# Patient Record
Sex: Female | Born: 1990 | Race: Black or African American | Hispanic: No | Marital: Single | Smoking: Never smoker
Health system: Southern US, Community
[De-identification: ages and names within clinical notes are randomized; demographics above are authoritative.]

## PROBLEM LIST (undated history)

## (undated) DIAGNOSIS — R7303 Prediabetes: Secondary | ICD-10-CM

## (undated) DIAGNOSIS — G4733 Obstructive sleep apnea (adult) (pediatric): Principal | ICD-10-CM

## (undated) DIAGNOSIS — Z460 Encounter for fitting and adjustment of spectacles and contact lenses: Secondary | ICD-10-CM

## (undated) DIAGNOSIS — R6889 Other general symptoms and signs: Secondary | ICD-10-CM

## (undated) DIAGNOSIS — R040 Epistaxis: Secondary | ICD-10-CM

## (undated) DIAGNOSIS — J349 Unspecified disorder of nose and nasal sinuses: Secondary | ICD-10-CM

## (undated) DIAGNOSIS — E282 Polycystic ovarian syndrome: Secondary | ICD-10-CM

## (undated) DIAGNOSIS — I10 Essential (primary) hypertension: Secondary | ICD-10-CM

## (undated) HISTORY — DX: Other general symptoms and signs: R68.89

## (undated) HISTORY — DX: Encounter for fitting and adjustment of spectacles and contact lenses: Z46.0

## (undated) HISTORY — DX: Epistaxis: R04.0

## (undated) HISTORY — DX: Polycystic ovarian syndrome: E28.2

## (undated) HISTORY — DX: Morbid (severe) obesity due to excess calories: E66.01

## (undated) HISTORY — DX: Essential (primary) hypertension: I10

## (undated) HISTORY — DX: Unspecified disorder of nose and nasal sinuses: J34.9

## (undated) HISTORY — DX: Prediabetes: R73.03

## (undated) HISTORY — DX: Obstructive sleep apnea (adult) (pediatric): G47.33

---
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
## 2008-07-16 ENCOUNTER — Emergency Department (HOSPITAL_COMMUNITY): Admission: EM | Admit: 2008-07-16 | Discharge: 2008-07-16 | Payer: Self-pay | Admitting: Emergency Medicine

## 2009-11-09 ENCOUNTER — Emergency Department (HOSPITAL_COMMUNITY): Admission: EM | Admit: 2009-11-09 | Discharge: 2009-11-10 | Payer: Self-pay | Admitting: Emergency Medicine

## 2010-05-21 LAB — RAPID STREP SCREEN (MED CTR MEBANE ONLY): Streptococcus, Group A Screen (Direct): NEGATIVE

## 2010-06-16 LAB — URINE MICROSCOPIC-ADD ON

## 2010-06-16 LAB — URINALYSIS, ROUTINE W REFLEX MICROSCOPIC
Bilirubin Urine: NEGATIVE
Glucose, UA: NEGATIVE mg/dL
Ketones, ur: NEGATIVE mg/dL
Nitrite: NEGATIVE
Protein, ur: NEGATIVE mg/dL
Specific Gravity, Urine: 1.022 (ref 1.005–1.030)
Urobilinogen, UA: 1 mg/dL (ref 0.0–1.0)
pH: 6.5 (ref 5.0–8.0)

## 2010-06-16 LAB — PREGNANCY, URINE: Preg Test, Ur: NEGATIVE

## 2010-06-22 ENCOUNTER — Other Ambulatory Visit: Payer: Self-pay | Admitting: Obstetrics and Gynecology

## 2010-06-22 ENCOUNTER — Other Ambulatory Visit (HOSPITAL_COMMUNITY)
Admission: RE | Admit: 2010-06-22 | Discharge: 2010-06-22 | Disposition: A | Discharge status: Home / Self Care | Payer: BC Managed Care – PPO | Source: Ambulatory Visit | Attending: Obstetrics and Gynecology | Admitting: Obstetrics and Gynecology

## 2010-06-22 DIAGNOSIS — Z113 Encounter for screening for infections with a predominantly sexual mode of transmission: Secondary | ICD-10-CM | POA: Insufficient documentation

## 2010-06-22 DIAGNOSIS — Z01419 Encounter for gynecological examination (general) (routine) without abnormal findings: Secondary | ICD-10-CM | POA: Insufficient documentation

## 2010-07-24 ENCOUNTER — Other Ambulatory Visit (INDEPENDENT_AMBULATORY_CARE_PROVIDER_SITE_OTHER): Payer: Self-pay | Admitting: Surgery

## 2010-07-24 NOTE — Group Therapy Note (Signed)
NAMETARISSA, KERIN NO.:  [id number]   MEDICAL RECORD NO.:  [id number]          PATIENT TYPE:  WOC   LOCATION:  [facility]                   FACILITY:  [facility]   PHYSICIAN:  Argentina Donovan, MD        DATE OF BIRTH:  12/20/1990   DATE OF SERVICE:                                    CLINIC NOTE   The patient is a 20 year old black female nulligravida, virginal, who was  placed on oral contraceptives approximately a year ago for polycystic  ovarian syndrome.  She weighs 245 pounds and is 5 feet 3 inches tall.  She  recently ran out of her prescription, her mother states, and then she  started having significant problems with dysfunctional uterine bleeding.  Was placed back on it and that has been well controlled since then.  We had  a long discussion with the patient and her mother about insulin  insensitivity, as well as polycystic ovarian syndrome.  We, in conjunction  with her mother, decided to try the Glucophage since she has already been on  the oral Tri-Cyclen for a while.  Her acne has been improved incredibly, her  mother says, since she has been on it and her periods are well controlled.  We discussed other methods of hormone therapy, as well as the patch and the  ring, but she seems to be doing well on taking the pill regularly.  It was a  long discussion concerning polycystic ovarian syndrome for 30 minutes.  I  went over the problem and I am going to see the patient back in 2 months to  see how she is doing on the Glucophage.           ______________________________  Argentina Donovan, MD     PR/MEDQ  D:  04/27/2005  T:  04/27/2005  Job:  [id number]

## 2010-07-30 ENCOUNTER — Ambulatory Visit (HOSPITAL_COMMUNITY)
Admission: RE | Admit: 2010-07-30 | Discharge: 2010-07-30 | Disposition: A | Discharge status: Home / Self Care | Payer: BC Managed Care – PPO | Source: Ambulatory Visit | Attending: Surgery | Admitting: Surgery

## 2010-07-30 DIAGNOSIS — Z6841 Body Mass Index (BMI) 40.0 and over, adult: Secondary | ICD-10-CM | POA: Insufficient documentation

## 2010-08-11 ENCOUNTER — Ambulatory Visit (HOSPITAL_COMMUNITY)
Admission: RE | Admit: 2010-08-11 | Discharge: 2010-08-11 | Disposition: A | Discharge status: Home / Self Care | Payer: BC Managed Care – PPO | Source: Ambulatory Visit | Attending: Surgery | Admitting: Surgery

## 2010-08-11 DIAGNOSIS — Z6841 Body Mass Index (BMI) 40.0 and over, adult: Secondary | ICD-10-CM | POA: Insufficient documentation

## 2010-08-11 DIAGNOSIS — Z01818 Encounter for other preprocedural examination: Secondary | ICD-10-CM | POA: Insufficient documentation

## 2010-08-14 ENCOUNTER — Encounter: Payer: BC Managed Care – PPO | Attending: Surgery | Admitting: *Deleted

## 2010-08-14 DIAGNOSIS — Z01818 Encounter for other preprocedural examination: Secondary | ICD-10-CM | POA: Insufficient documentation

## 2010-08-14 DIAGNOSIS — Z713 Dietary counseling and surveillance: Secondary | ICD-10-CM | POA: Insufficient documentation

## 2010-08-25 ENCOUNTER — Ambulatory Visit: Payer: BC Managed Care – PPO | Admitting: *Deleted

## 2010-09-01 ENCOUNTER — Ambulatory Visit (HOSPITAL_BASED_OUTPATIENT_CLINIC_OR_DEPARTMENT_OTHER): Payer: BC Managed Care – PPO | Attending: Surgery

## 2010-09-01 DIAGNOSIS — G4733 Obstructive sleep apnea (adult) (pediatric): Secondary | ICD-10-CM | POA: Insufficient documentation

## 2010-09-06 DIAGNOSIS — G4733 Obstructive sleep apnea (adult) (pediatric): Secondary | ICD-10-CM

## 2010-09-06 NOTE — Procedures (Signed)
Marissa Miller, AHART NO.:  [id number]  MEDICAL RECORD NO.:  [id number]          PATIENT TYPE:  OUT  LOCATION:  SLEEP CENTER                 FACILITY:  [facility]  PHYSICIAN:  Deakon Frix D. Maple Hudson, MD, FCCP, FACPDATE OF BIRTH:  06/15/88  DATE OF STUDY:  09/01/2010                           NOCTURNAL POLYSOMNOGRAM  REFERRING PHYSICIAN:  Thornton Park. Daphine Deutscher, MD  INDICATION FOR STUDY:  Hypersomnia with sleep apnea.  EPWORTH SLEEPINESS SCORE:  5/24.  BMI 58.4.  Weight 340 pounds, height 64 inches.  Neck 16 inches.  MEDICATIONS:  Home medications are charted and reviewed.  SLEEP ARCHITECTURE:  Total sleep time 257.5 minutes with sleep efficiency 70.2%.  Stage I was 22.1%, stage II 59.8%, stage III 3.1%, REM 15% of total sleep time.  Sleep latency 50 minutes, REM latency 90 minutes, awake after sleep onset 59 minutes, arousal index 43.1.  Bedtime medication:  Metformin.  RESPIRATORY DATA:  Apnea/hypopnea index (AHI) 12.3 per hour.  A total of 53 events was scored including 9 obstructive apneas and 44 hypopneas. Events were seen in all sleep positions, especially while supine.  REM AHI 21.8 per hour, RDI 41.2 per hour.  Sleep was initially quite fragmented and there was insufficient early sleep or events to permit initiation of CPAP titration by split protocol on this study night.  OXYGEN DATA:  Moderate snoring with oxygen desaturation to a nadir of 88% and a mean oxygen saturation through the study of 96.1% on room air.  CARDIAC DATA:  Sinus rhythm with PACs.  MOVEMENT-PARASOMNIA:  No significant movement disturbance.  Bathroom x1.  IMPRESSIONS-RECOMMENDATIONS: 1. Mild obstructive sleep apnea/hypopnea syndrome, apnea/hypopnea     index 12.3 per hour with non-positional events, increased while     supine and in REM.  Moderate snoring with oxygen desaturation to a     nadir of 88% and a mean oxygen saturation through the study of     96.1% on room air. 2. She did  not meet requirements for initiation of continuous positive     airway pressure split protocol titration on     the study night.  Consider return for a dedicated CPAP titration     study, or evaluate for alternative management as clinically     indicated.     [name], MD, [facility], FACP Diplomate, Biomedical engineer of Sleep Medicine Electronically Signed    CDY/MEDQ  D:  09/06/2010 07:30:37  T:  09/06/2010 23:11:41  Job:  [id number]

## 2010-10-15 ENCOUNTER — Encounter: Payer: BC Managed Care – PPO | Attending: Surgery

## 2010-10-15 ENCOUNTER — Ambulatory Visit: Payer: BC Managed Care – PPO

## 2010-10-15 DIAGNOSIS — I10 Essential (primary) hypertension: Secondary | ICD-10-CM | POA: Insufficient documentation

## 2010-10-15 DIAGNOSIS — Z713 Dietary counseling and surveillance: Secondary | ICD-10-CM | POA: Insufficient documentation

## 2010-10-15 DIAGNOSIS — G4730 Sleep apnea, unspecified: Secondary | ICD-10-CM

## 2010-10-15 DIAGNOSIS — Z01818 Encounter for other preprocedural examination: Secondary | ICD-10-CM | POA: Insufficient documentation

## 2010-10-15 DIAGNOSIS — R7303 Prediabetes: Secondary | ICD-10-CM

## 2010-10-15 NOTE — Progress Notes (Signed)
  Pre-Operative Nutrition Class  Patient was seen on 10/15/2010 for Pre-Operative Nutrition education at [facility]   Surgery date: 11/10/10 Surgery type: LAGB  Weight today: 344.8 lbs Weight change: 3.4lbs gain Total weight lost: N/A BMI: 59.1  Samples given per MNT protocol: Bariatric Advantage Multivitamin Lot # [id number] Exp: 12/12  Bariatric Advantage Calcium Citrate Lot # [id number] Exp: 9/13  Celebrate Vitamins Multivitamin Lot # [id number] Exp: 9/13  Celebrate VitaminsCalcium Citrate Lot #[id number] Exp: 4/13  The following the learning objective met by the patient during this course:   Identifies Pre-Op Dietary Goals and will begin 2 weeks pre-operatively   Identifies appropriate sources of fluids and proteins   States protein recommendations and appropriate sources pre and post-operatively  Identifies Post-Operative Dietary Goals and will follow for 2 weeks post-operatively  Identifies appropriate multivitamin and calcium sources  Describes the need for physical activity post-operatively and will follow MD recommendations  States when to call healthcare provider regarding medication questions or post-operative complications  Follow-Up Plan: Patient will follow-up at [facility] 2 weeks post operatively for diet advancement per MD.

## 2010-10-16 ENCOUNTER — Institutional Professional Consult (permissible substitution): Payer: BC Managed Care – PPO | Admitting: Pulmonary Disease

## 2010-10-27 ENCOUNTER — Encounter (INDEPENDENT_AMBULATORY_CARE_PROVIDER_SITE_OTHER): Payer: Self-pay | Admitting: General Surgery

## 2010-10-28 ENCOUNTER — Encounter (INDEPENDENT_AMBULATORY_CARE_PROVIDER_SITE_OTHER): Payer: Self-pay | Admitting: Surgery

## 2010-10-28 ENCOUNTER — Other Ambulatory Visit (INDEPENDENT_AMBULATORY_CARE_PROVIDER_SITE_OTHER): Payer: Self-pay | Admitting: General Surgery

## 2010-10-28 ENCOUNTER — Ambulatory Visit (INDEPENDENT_AMBULATORY_CARE_PROVIDER_SITE_OTHER): Payer: BC Managed Care – PPO | Admitting: Surgery

## 2010-10-28 DIAGNOSIS — Z419 Encounter for procedure for purposes other than remedying health state, unspecified: Secondary | ICD-10-CM

## 2010-10-28 MED ORDER — PEG 3350-KCL-NA BICARB-NACL 420 G PO SOLR: 4000.0000 mL | Freq: Once | ORAL | Status: AC

## 2010-10-28 NOTE — Patient Instructions (Signed)
Follow the preop low carb diet

## 2010-10-28 NOTE — Progress Notes (Signed)
Subjective:     Patient ID: Marissa Miller, female   DOB: 1990/07/13, 20 y.o.   MRN: [id number]  HPI The patient comes in today for a preop visit. She has no further questions about the procedure. I encouraged her to stay on the preoperative diet because it appears that she has gained a little weight since her initial visit of Jul 24, 2010. Today's weight is 343 and she was 337 4. Before.   Dr. Nehemiah Settle has detected  borderline diabetes and has elevations in her blood pressure. She  Wants to proceed with a LAP-BAND and had no further questions.  Her preoperative upper GI was normal and showed no hiatal hernia or reflux. Her gallbladder ultrasound showed no gallstones or abnormalities. Chest x-ray was unremarkable. EKG was normal laboratory showed her hemoglobin to be 12.3 electrolytes normal. Cholesterol normal. Hemoglobin A1c was 5.9 which is slightly elevated.  Will proceed with LAP-BAND on September 4.  Past Medical History  Diagnosis Date  . Morbid obesity   . Pre-diabetes   . Hypertension   . PCOS (polycystic ovarian syndrome)    Current Outpatient Prescriptions  Medication Sig Dispense Refill  . IBUPROFEN PO Take by mouth as needed.        . metFORMIN (GLUMETZA) 500 MG (MOD) 24 hr tablet Take 500 mg by mouth daily with breakfast.        . norgestimate-ethinyl estradiol (ORTHO-CYCLEN,SPRINTEC,PREVIFEM) 0.25-35 MG-MCG tablet Take 1 tablet by mouth daily.        History reviewed. No pertinent past surgical history.   Review of Systems  Constitutional: Negative.   HENT: Positive for nosebleeds and rhinorrhea.   Eyes: Negative.   Respiratory: Negative.   Cardiovascular: Negative.   Gastrointestinal: Negative.   Genitourinary: Negative.   Neurological: Negative.   Hematological: Negative.   Psychiatric/Behavioral: Negative.        Objective:   Physical Exam  Constitutional: She is oriented to person, place, and time. She appears well-developed and well-nourished.  HENT:  Head:  Atraumatic.  Eyes: Conjunctivae are normal. Pupils are equal, round, and reactive to light.  Neck: Normal range of motion. Neck supple.  Cardiovascular: Normal rate, regular rhythm and normal heart sounds.   Pulmonary/Chest: Effort normal and breath sounds normal.  Abdominal: Soft. Bowel sounds are normal.  Musculoskeletal: Normal range of motion.  Neurological: She is alert and oriented to person, place, and time. She has normal reflexes.  Skin: Skin is warm and dry.  Psychiatric: She has a normal mood and affect. Her behavior is normal. Judgment and thought content normal.   Filed Vitals:   10/28/10 1555  BP: 128/88  Pulse: 60  Temp: 96.6 F (35.9 C)  Resp: 16   Weight 343 BMI 58.6      Assessment:     Morbid obesity with Type II Diabetes    Plan:     Lapband

## 2010-10-29 ENCOUNTER — Ambulatory Visit: Payer: BC Managed Care – PPO

## 2010-10-30 ENCOUNTER — Other Ambulatory Visit (HOSPITAL_COMMUNITY): Payer: BC Managed Care – PPO

## 2010-11-03 ENCOUNTER — Other Ambulatory Visit (INDEPENDENT_AMBULATORY_CARE_PROVIDER_SITE_OTHER): Payer: Self-pay | Admitting: Surgery

## 2010-11-03 ENCOUNTER — Other Ambulatory Visit (HOSPITAL_COMMUNITY): Payer: BC Managed Care – PPO

## 2010-11-03 ENCOUNTER — Ambulatory Visit (INDEPENDENT_AMBULATORY_CARE_PROVIDER_SITE_OTHER): Payer: BC Managed Care – PPO | Admitting: Pulmonary Disease

## 2010-11-03 ENCOUNTER — Encounter: Payer: Self-pay | Admitting: Pulmonary Disease

## 2010-11-03 ENCOUNTER — Encounter (HOSPITAL_COMMUNITY)
Admission: RE | Admit: 2010-11-03 | Discharge: 2010-11-03 | Disposition: A | Discharge status: Home / Self Care | Payer: BC Managed Care – PPO | Source: Ambulatory Visit | Attending: Surgery | Admitting: Surgery

## 2010-11-03 VITALS — BP 118/86 | HR 112 | Temp 98.1°F | Ht 64.0 in | Wt 340.6 lb

## 2010-11-03 DIAGNOSIS — G4733 Obstructive sleep apnea (adult) (pediatric): Secondary | ICD-10-CM

## 2010-11-03 HISTORY — DX: Obstructive sleep apnea (adult) (pediatric): G47.33

## 2010-11-03 LAB — CBC
HCT: 36.5 % (ref 36.0–46.0)
Hemoglobin: 12.8 g/dL (ref 12.0–15.0)
MCH: 25.5 pg — ABNORMAL LOW (ref 26.0–34.0)
MCHC: 35.1 g/dL (ref 30.0–36.0)
MCV: 72.9 fL — ABNORMAL LOW (ref 78.0–100.0)
Platelets: 359 10*3/uL (ref 150–400)
RBC: 5.01 MIL/uL (ref 3.87–5.11)
RDW: 14.7 % (ref 11.5–15.5)
WBC: 9.8 10*3/uL (ref 4.0–10.5)

## 2010-11-03 LAB — SURGICAL PCR SCREEN
MRSA, PCR: NEGATIVE
Staphylococcus aureus: NEGATIVE

## 2010-11-03 LAB — COMPREHENSIVE METABOLIC PANEL
ALT: 30 U/L (ref 0–35)
AST: 24 U/L (ref 0–37)
Albumin: 3.7 g/dL (ref 3.5–5.2)
Alkaline Phosphatase: 65 U/L (ref 39–117)
BUN: 15 mg/dL (ref 6–23)
CO2: 28 mEq/L (ref 19–32)
Calcium: 9.9 mg/dL (ref 8.4–10.5)
Chloride: 99 mEq/L (ref 96–112)
Creatinine, Ser: 0.68 mg/dL (ref 0.50–1.10)
GFR calc Af Amer: >60 mL/min (ref >60)
GFR calc non Af Amer: >60 mL/min (ref >60)
Glucose, Bld: 94 mg/dL (ref 70–99)
Potassium: 4.3 mEq/L (ref 3.5–5.1)
Sodium: 136 mEq/L (ref 135–145)
Total Bilirubin: 0.6 mg/dL (ref 0.3–1.2)
Total Protein: 7.8 g/dL (ref 6.0–8.3)

## 2010-11-03 LAB — DIFFERENTIAL
Basophils Absolute: 0 10*3/uL (ref 0.0–0.1)
Basophils Relative: 0 % (ref 0–1)
Eosinophils Absolute: 0.2 10*3/uL (ref 0.0–0.7)
Eosinophils Relative: 2 % (ref 0–5)
Lymphocytes Relative: 27 % (ref 12–46)
Lymphs Abs: 2.6 10*3/uL (ref 0.7–4.0)
Monocytes Absolute: 0.8 10*3/uL (ref 0.1–1.0)
Monocytes Relative: 8 % (ref 3–12)
Neutro Abs: 6.2 10*3/uL (ref 1.7–7.7)
Neutrophils Relative %: 63 % (ref 43–77)

## 2010-11-03 LAB — HCG, SERUM, QUALITATIVE: Preg, Serum: NEGATIVE

## 2010-11-03 NOTE — Patient Instructions (Signed)
Will start you on cpap for your sleep apnea leading up to your surgery You need to take your cpap mask to the surgery, and they will put you on a cpap machine in the hospital to use at night while sleeping The goal is to lose weight, and your sleep apnea will resolve. followup with me in 4 weeks, but call me if having tolerance issues.

## 2010-11-03 NOTE — Progress Notes (Signed)
  Subjective:    Patient ID: Marissa Miller, female    DOB: [birth year], 20 y.o.   MRN: [id number]  HPI The patient is a morbidly obese 20 year old female who been asked to see for management of obstructive sleep apnea.  She is undergone recent sleep study, which shows mild sleep apnea with an AHI of 12 events per hour.  The patient is scheduled for bariatric surgery the beginning of next month.  She has been told that she is a loud snorer, but no one has mentioned an abnormal breathing pattern during sleep.  She feels that she is rested most mornings upon arising, and stays very busy at work which prohibits her from having significant sleep pressure.  However, she denies inappropriate daytime sleepiness in the evenings or while driving.  She has no issues watching movies or TV programs.  Of note, the patient's weight is up 50 pounds over the last 2 years, and her recent epworth score is only 5.  Sleep Questionnaire: What time do you typically go to bed?( Between what hours) 12 am How long does it take you to fall asleep? 10 to 15 mins How many times during the night do you wake up? 2 What time do you get out of bed to start your day? 0930 Do you drive or operate heavy machinery in your occupation? No How much has your weight changed (up or down) over the past two years? (In pounds) 50 lb (22.68 kg) Have you ever had a sleep study before? Yes If yes, location of study? [facility] If yes, date of study? 08/2010 Do you currently use CPAP? No Do you wear oxygen at any time? No    Review of Systems  Constitutional: Negative for fever and unexpected weight change.  HENT: Negative for ear pain, nosebleeds, congestion, sore throat, rhinorrhea, sneezing, trouble swallowing, dental problem, postnasal drip and sinus pressure.   Eyes: Negative for redness and itching.  Respiratory: Negative for cough, chest tightness, shortness of breath and wheezing.   Cardiovascular: Negative for palpitations and leg swelling.    Gastrointestinal: Negative for nausea and vomiting.  Genitourinary: Negative for dysuria.  Musculoskeletal: Negative for joint swelling.  Skin: Negative for rash.  Neurological: Negative for headaches.  Hematological: Does not bruise/bleed easily.  Psychiatric/Behavioral: Negative for dysphoric mood. The patient is not nervous/anxious.        Objective:   Physical Exam Constitutional:  Morbidly obese female, no acute distress  HENT:  Nares patent without discharge, but large turbs  Oropharynx without exudate, palate and uvula are elongated, very large tonsils.   Eyes:  Perrla, eomi, no scleral icterus  Neck:  No JVD, no TMG  Cardiovascular:  Normal rate, regular rhythm, no rubs or gallops.  No murmurs        Intact distal pulses  Pulmonary :  Normal breath sounds, no stridor or respiratory distress   No rales, rhonchi, or wheezing  Abdominal:  Soft, nondistended, bowel sounds present.  No tenderness noted.   Musculoskeletal:  No lower extremity edema noted.  Lymph Nodes:  No cervical lymphadenopathy noted  Skin:  No cyanosis noted  Neurologic:  Alert, appropriate, moves all 4 extremities without obvious deficit.         Assessment & Plan:

## 2010-11-03 NOTE — Assessment & Plan Note (Signed)
The patient has mild sleep apnea by her recent sleep study, but is undergoing bariatric surgery next month.  I have explained to her the sedating and pain meds can often result in worsening of her sleep apnea postoperatively, and therefore it would be to her advantage to treat during this time.  Studies have also shown that patients with sleep apnea undergoing bariatric surgery have improved results if their sleep apnea is treated ahead of time and during the postop period.  The patient is willing to give CPAP a try, and we'll therefore start her out at a moderate pressure level.  I have asked her to take her CPAP mask to her bariatric surgery, and they can put her on an auto titrating machine while there.

## 2010-11-10 ENCOUNTER — Ambulatory Visit (HOSPITAL_COMMUNITY)
Admission: RE | Admit: 2010-11-10 | Discharge: 2010-11-11 | Disposition: A | Discharge status: Home / Self Care | Payer: BC Managed Care – PPO | Source: Ambulatory Visit | Attending: Surgery | Admitting: Surgery

## 2010-11-10 DIAGNOSIS — E282 Polycystic ovarian syndrome: Secondary | ICD-10-CM

## 2010-11-10 DIAGNOSIS — Z6841 Body Mass Index (BMI) 40.0 and over, adult: Secondary | ICD-10-CM

## 2010-11-10 DIAGNOSIS — I10 Essential (primary) hypertension: Secondary | ICD-10-CM

## 2010-11-10 DIAGNOSIS — Z01812 Encounter for preprocedural laboratory examination: Secondary | ICD-10-CM | POA: Insufficient documentation

## 2010-11-10 DIAGNOSIS — G4733 Obstructive sleep apnea (adult) (pediatric): Secondary | ICD-10-CM | POA: Insufficient documentation

## 2010-11-10 HISTORY — PX: LAPAROSCOPIC GASTRIC BANDING: SHX1100

## 2010-11-11 ENCOUNTER — Ambulatory Visit (HOSPITAL_COMMUNITY): Payer: BC Managed Care – PPO

## 2010-11-11 DIAGNOSIS — Z9889 Other specified postprocedural states: Secondary | ICD-10-CM

## 2010-11-11 LAB — CBC
HCT: 37.3 % (ref 36.0–46.0)
Hemoglobin: 12.5 g/dL (ref 12.0–15.0)
MCH: 24.7 pg — ABNORMAL LOW (ref 26.0–34.0)
MCHC: 33.5 g/dL (ref 30.0–36.0)
MCV: 73.7 fL — ABNORMAL LOW (ref 78.0–100.0)
Platelets: 385 10*3/uL (ref 150–400)
RBC: 5.06 MIL/uL (ref 3.87–5.11)
RDW: 14.8 % (ref 11.5–15.5)
WBC: 12.9 10*3/uL — ABNORMAL HIGH (ref 4.0–10.5)

## 2010-11-11 LAB — DIFFERENTIAL
Basophils Absolute: 0 10*3/uL (ref 0.0–0.1)
Basophils Relative: 0 % (ref 0–1)
Eosinophils Absolute: 0 10*3/uL (ref 0.0–0.7)
Eosinophils Relative: 0 % (ref 0–5)
Lymphocytes Relative: 17 % (ref 12–46)
Lymphs Abs: 2.2 10*3/uL (ref 0.7–4.0)
Monocytes Absolute: 0.9 10*3/uL (ref 0.1–1.0)
Monocytes Relative: 7 % (ref 3–12)
Neutro Abs: 9.8 10*3/uL — ABNORMAL HIGH (ref 1.7–7.7)
Neutrophils Relative %: 76 % (ref 43–77)

## 2010-11-11 NOTE — Op Note (Signed)
  Marissa Miller, Marissa Miller NO.:  [id number]  MEDICAL RECORD NO.:  [id number]  LOCATION:  1524                         FACILITY:  [facility]  PHYSICIAN:  Thornton Park. Daphine Deutscher, MD  DATE OF BIRTH:  [birth year]  DATE OF PROCEDURE:  11/10/2010 DATE OF DISCHARGE:                              OPERATIVE REPORT   PREOPERATIVE DIAGNOSES:  Borderline diabetes and morbid obesity with BMI of 58, hypertension, and polycystic ovary syndrome.  PROCEDURE:  Laparoscopic adjustable gastric banding (Allergan APS system).  SURGEON:  Thornton Park. Daphine Deutscher, MD  ASSISTANT:  Sandria Bales. Ezzard Standing, MD  FINDINGS:  No hiatal hernia noted.  High APS system placed.  DESCRIPTION OF PROCEDURE:  [name] was taken to the OR on Tuesday, November 10, 2010, and given general anesthesia.  The abdomen was prepped with __________.  Time-out was performed.  The abdomen was entered through the left upper quadrant using 0 degree Optiview 10 mm without difficulty.  The abdomen was insufflated.  Standard trocar placements were used including a 15 placed obliquely in the right upper quadrant, and she was noted to have a very elastic abdomen.  The liver was retracted and no dimple was seen.  There was no preop history of reflux.  We went along and opened an area on the left side for the band passer and then opened the space on the right side where we could see and go along the right crus.  The band passer passed easily, came around behind the stomach.  An APS was chosen because despite her high BMI she did not have as much fat around her foregut.  This came on, snapped in place. After it was passed around to the band passer easily, we snapped it over the calibration tubing.  Calibration tube was withdrawn.  It was plicated with three sutures with a Surgidac and tie knots.  The tubing was then brought to the port on the right lower side, connected to a port and implanted in a subcutaneous location.  The patient  tolerated the procedure well.  Wound ports were closed with 4-0 Vicryl, benzoin and Steri-Strips.  The patient was taken to the recovery room in satisfactory addition.     Thornton Park Daphine Deutscher, MD     MBM/MEDQ  D:  11/10/2010  T:  11/10/2010  Job:  [id number]  cc:   Deirdre Peer. Polite, M.D. Fax: [phone]  Electronically Signed by Luretha Murphy MD on 11/11/2010 01:39:37 PM

## 2010-11-12 ENCOUNTER — Encounter: Payer: Self-pay | Admitting: Surgery

## 2010-11-24 ENCOUNTER — Encounter: Payer: BC Managed Care – PPO | Attending: Surgery | Admitting: *Deleted

## 2010-11-24 DIAGNOSIS — Z713 Dietary counseling and surveillance: Secondary | ICD-10-CM | POA: Insufficient documentation

## 2010-11-24 DIAGNOSIS — Z01818 Encounter for other preprocedural examination: Secondary | ICD-10-CM | POA: Insufficient documentation

## 2010-11-24 NOTE — Progress Notes (Signed)
  2 Week Post-Operative Nutrition Class  Patient was seen on 11/24/2010 for Post-Operative Nutrition education at [facility].   Surgery date: 11/10/10 Surgery type: LAGB  Weight today: 331.1 Weight change: 10.7 lbs lost BMI: 57%  The following the learning objective met the patient during this course:   Identifies Phase 3A (Soft, High Proteins) Dietary Goals and will begin from 2 weeks post-operatively to 2 months post-operatively   Identifies appropriate sources of fluids and proteins   States protein recommendations and appropriate sources post-operatively  Identifies the need for appropriate texture modifications, mastication, and bite sizes when consuming solids  Identifies appropriate multivitamin and calcium sources post-operatively  Describes the need for physical activity post-operatively and will follow MD recommendations  States when to call healthcare provider regarding medication questions or post-operative complications  Handouts given during class include:  Phase 3A: Soft, High Protein Diet Handout  Band Fill Guidelines Handout  Follow-Up Plan: Patient will follow-up at [facility] in 6 weeks for 2 months post-op nutrition visit for diet advancement per MD.

## 2010-11-24 NOTE — Patient Instructions (Signed)
Patient to follow Phase 3A-Soft, High Protein Diet and follow-up at [facility] in 6 weeks for 2 months post-op nutrition visit for diet advancement. 

## 2010-11-26 ENCOUNTER — Ambulatory Visit (INDEPENDENT_AMBULATORY_CARE_PROVIDER_SITE_OTHER): Payer: BC Managed Care – PPO | Admitting: Surgery

## 2010-11-26 ENCOUNTER — Encounter (INDEPENDENT_AMBULATORY_CARE_PROVIDER_SITE_OTHER): Payer: Self-pay | Admitting: Surgery

## 2010-11-26 NOTE — Progress Notes (Signed)
First postoperative visit for Marissa Miller who is 16 days postop. She has had modest weight loss since her surgery being down to 331.4 today. We will consider her for first fill in mid October. Her incisions are healing nicely. We've encouraged her to begin exercise and stay on the diet as prescribed. She will return to work on Sept 26

## 2010-12-01 ENCOUNTER — Ambulatory Visit: Payer: BC Managed Care – PPO | Admitting: Pulmonary Disease

## 2011-01-04 ENCOUNTER — Encounter: Payer: Self-pay | Admitting: *Deleted

## 2011-01-04 ENCOUNTER — Encounter: Payer: BC Managed Care – PPO | Attending: Surgery | Admitting: *Deleted

## 2011-01-04 DIAGNOSIS — Z713 Dietary counseling and surveillance: Secondary | ICD-10-CM | POA: Insufficient documentation

## 2011-01-04 DIAGNOSIS — Z01818 Encounter for other preprocedural examination: Secondary | ICD-10-CM | POA: Insufficient documentation

## 2011-01-04 NOTE — Progress Notes (Signed)
  Follow-up visit: 2 Months Post-Operative LAGB Surgery  Medical Nutrition Therapy:  Appt start time: 1500 end time:  1520.  Assessment:  Primary concerns today: post-operative bariatric surgery nutrition management. Fawna reports frustration with her LAGB due to no restriction in her portions and limited weight loss. She is to have a band fill per Dr. Daphine Deutscher next week.  Weight today: 331.8 lb Weight change: No changes Total weight lost: 10 lbs down BMI: 57.1 Weight goal: 150-200 lbs Surgery date: 11/10/10  24-hr recall:  B (AM): Atkin's Protein Drink Snk (AM): yogurt cup (Greek)   L (12-1 PM): Grilled chicken (3-6 oz), green beans (1 cup), rice (1 cup) Snk (PM): No snacking  D (PM): Pork chop, salad OR Hamburger w/out bun (5-7 oz) Snk (PM): yogurt cup OR Protein Drink  Fluid intake: Crystal Light, water, juices = 32 oz - 50 oz Estimated total protein intake: 60-75g  Medications: No medication changes Supplementation: Taking regularly  Using straws: No Drinking while eating: YES Hair loss: None reported Carbonated beverages: No N/V/D/C: Regurgitation if takes a large bite Last Lap-Band fill: No fill at this time  Recent physical activity:  Going to the gym, 3 times/week,   Progress Towards Goal(s):  Some progress.  Handouts given during visit include:  Phase 3B - High Protein + Non-Starchy Vegetables   Nutritional Diagnosis:  NI-1.5 Excessive energy intake As related to limited restriction with LAGB.  As evidenced by pt who has not had a band fill, eating portion sizes of 6-8 oz at one sitting.    Intervention:  Nutrition education.  Monitoring/Evaluation:  Dietary intake, exercise, lap band fills, and body weight. Follow up in 6 weeks for 3-4 month post-op visit.

## 2011-01-04 NOTE — Patient Instructions (Signed)
Goals:  Follow Phase 3B: High Protein + Non-Starchy Vegetables  Eat 3-6 small meals/snacks, every 3-5 hrs  Increase lean protein foods to meet 65-85g goal  Increase fluid intake to 64oz +  Avoid drinking 15 minutes before, during and 30 minutes after eating  Aim for >30 min of physical activity daily

## 2011-01-13 ENCOUNTER — Ambulatory Visit (INDEPENDENT_AMBULATORY_CARE_PROVIDER_SITE_OTHER): Payer: BC Managed Care – PPO | Admitting: Surgery

## 2011-01-13 ENCOUNTER — Encounter (INDEPENDENT_AMBULATORY_CARE_PROVIDER_SITE_OTHER): Payer: Self-pay | Admitting: Surgery

## 2011-01-13 VITALS — BP 138/88 | HR 80 | Temp 97.2°F | Resp 20 | Ht 64.0 in | Wt 330.0 lb

## 2011-01-13 DIAGNOSIS — Z9884 Bariatric surgery status: Secondary | ICD-10-CM

## 2011-01-13 NOTE — Progress Notes (Signed)
Marissa Miller 20 y.o.  Body mass index is 56.64 kg/(m^2).  Patient Active Problem List  Diagnoses  . Morbid obesity  . HTN (hypertension)  . Pre-diabetes  . OSA (obstructive sleep apnea)    No Known Allergies  Past Surgical History  Procedure Date  . Laparoscopic gastric banding 11/10/10   POLITE,RONALD D, MD No diagnosis found.  Patient comes in today and she is 2 months postop. She has an APS band in place. I gave her 1.5 cc in her band today. She lost about 14 pounds since surgery. We'll see her back in 4-6 weeks.  Matt B. Daphine Deutscher, MD, [facility] [phone] beeper [phone]  01/13/2011 5:19 PM

## 2011-02-15 ENCOUNTER — Encounter: Payer: Self-pay | Admitting: *Deleted

## 2011-02-15 ENCOUNTER — Encounter: Payer: BC Managed Care – PPO | Attending: Surgery | Admitting: *Deleted

## 2011-02-15 DIAGNOSIS — Z01818 Encounter for other preprocedural examination: Secondary | ICD-10-CM | POA: Insufficient documentation

## 2011-02-15 DIAGNOSIS — Z713 Dietary counseling and surveillance: Secondary | ICD-10-CM | POA: Insufficient documentation

## 2011-02-15 NOTE — Patient Instructions (Addendum)
Goals:  Continue on High Protein/Low Carb Regimen Follow Phase 3B: High Protein + Non-Starchy Vegetables  Eat 3-6 small meals/snacks, every 3-5 hrs  Increase lean protein foods to meet 65-85g goal  Increase fluid intake to 64oz +  AVOID drinking 15 minutes before, during and 30 minutes after eating  Aim for >30 min of physical activity daily Utilize protein bars if needed in place of sweets [facility], [employer]'s or [employer])  Down 3 lbs in 6 weeks, lost a total of 13 lbs.  Keep up the good work! Limit carbohydrate foods.  Merry Christmas!!

## 2011-02-15 NOTE — Progress Notes (Signed)
  Follow-up visit: 3 Months Post-Operative LAGB Surgery   Medical Nutrition Therapy: Appt start time: 1500 end time: 1520.   Assessment: Primary concerns today: post-operative bariatric surgery nutrition management. Marissa Miller reports continues to be frustrated with her LAGB due to no restriction in her portions.  Weight today: 328.8  lb  Weight change: 3 lbs Total weight lost: 13 lbs total BMI: 57.1  Weight goal: 150-200 lbs   Surgery date: 11/10/10 Start wt @ [facility]: 341.8 lbs   24-hr recall:  B (AM): Atkin's Protein Drink OR yogurt cup (6 oz) Snk (AM): n/a L (12-1 PM): Lean Cuisine Meal (<300 cal) Snk (PM): cheese stick OR yogurt cup D (PM): Pork chop (5 oz), rice (1 cup rice), broccoli (1 cup) Snk (PM): n/a  Fluid intake: Crystal Light, water, juices = 32 oz - 50 oz  Estimated total protein intake: 60-75g   Medications: No medication changes  Supplementation: Taking regularly   Using straws: No  Drinking while eating: YES (6 oz-12 oz) Hair loss: None reported  Carbonated beverages: No  N/V/D/C: Regurgitation if takes a large bite or eats too fast  Last Lap-Band fill: Pt had band fill November 2012 per Dr. Daphine Deutscher  Recent physical activity: Going to the gym, 3 times/week for 45 minutes w/ [employer]  Progress Towards Goal(s): Some progress.   Handouts given during visit include:  Phase 3B - High Protein + Non-Starchy Vegetables  Nutritional Diagnosis:  NI-1.5 Excessive energy intake As related to limited restriction with LAGB. As evidenced by pt who has not had a band fill, eating portion sizes of 6-8 oz at one sitting.   Intervention: Nutrition education.   Monitoring/Evaluation: Dietary intake, exercise, lap band fills, and body weight. Follow up in 12 weeks for 6 month post-op visit.

## 2011-02-24 ENCOUNTER — Ambulatory Visit (INDEPENDENT_AMBULATORY_CARE_PROVIDER_SITE_OTHER): Payer: BC Managed Care – PPO | Admitting: Surgery

## 2011-02-24 DIAGNOSIS — Z9884 Bariatric surgery status: Secondary | ICD-10-CM

## 2011-02-24 NOTE — Progress Notes (Signed)
Marissa Miller There is no height or weight on file to calculate BMI.  Having regurgitation:  no  Nocturnal reflux?  no  Amount of fill  1  Marissa Miller and her friend came in today. I accessed her port and placed 1 cc in her band. She's had a total of 2-1/2 cc added to her APS band. I will see her back in 6 weeks in followup. I've monitor him against eating any carbohydrates. Told her to take liquids for 24th 36 hours.  Return 6 weeks

## 2011-02-24 NOTE — Patient Instructions (Signed)

## 2011-04-08 ENCOUNTER — Encounter (INDEPENDENT_AMBULATORY_CARE_PROVIDER_SITE_OTHER): Payer: Self-pay | Admitting: Surgery

## 2011-04-08 ENCOUNTER — Ambulatory Visit (INDEPENDENT_AMBULATORY_CARE_PROVIDER_SITE_OTHER): Payer: BC Managed Care – PPO | Admitting: Surgery

## 2011-04-08 VITALS — BP 122/84 | HR 92 | Temp 97.4°F | Resp 18 | Ht 64.0 in | Wt 324.6 lb

## 2011-04-08 DIAGNOSIS — Z9884 Bariatric surgery status: Secondary | ICD-10-CM

## 2011-04-08 NOTE — Progress Notes (Signed)
Marissa Miller Body mass index is 55.72 kg/(m^2).  Having regurgitation:  no  Nocturnal reflux?  no  Amount of fill  1 Return 4-6 weeks

## 2011-04-08 NOTE — Patient Instructions (Signed)

## 2011-05-17 ENCOUNTER — Ambulatory Visit: Payer: BC Managed Care – PPO | Admitting: *Deleted

## 2011-05-20 ENCOUNTER — Encounter (INDEPENDENT_AMBULATORY_CARE_PROVIDER_SITE_OTHER): Payer: BC Managed Care – PPO

## 2011-05-24 ENCOUNTER — Ambulatory Visit: Payer: BC Managed Care – PPO | Admitting: *Deleted

## 2011-07-08 ENCOUNTER — Ambulatory Visit (INDEPENDENT_AMBULATORY_CARE_PROVIDER_SITE_OTHER): Payer: BC Managed Care – PPO | Admitting: Physician Assistant

## 2011-07-08 ENCOUNTER — Encounter (INDEPENDENT_AMBULATORY_CARE_PROVIDER_SITE_OTHER): Payer: Self-pay

## 2011-07-08 VITALS — BP 128/88 | HR 88 | Temp 98.4°F | Resp 16 | Ht 64.0 in | Wt 311.8 lb

## 2011-07-08 DIAGNOSIS — Z4651 Encounter for fitting and adjustment of gastric lap band: Secondary | ICD-10-CM

## 2011-07-08 NOTE — Patient Instructions (Signed)
Take clear liquids tonight. Thin protein shakes are ok to start tomorrow morning. Slowly advance your diet thereafter. Call us if you have persistent vomiting or regurgitation, night cough or reflux symptoms. Return as scheduled or sooner if you notice no changes in hunger/portion sizes.  

## 2011-07-08 NOTE — Progress Notes (Signed)
  HISTORY: Marissa Miller is a 21 y.o.female who received an AP-Standard lap-band in September 2012 by Dr. Daphine Deutscher. She has lost 13 lbs since her last visit in January. She denies persistent regurgitation or reflux symptoms but has noticed a slight increase in her hunger and portion sizes.  VITAL SIGNS: Filed Vitals:   07/08/11 1107  BP: 128/88  Pulse: 88  Temp: 98.4 F (36.9 C)  Resp: 16    PHYSICAL EXAM: Physical exam reveals a very well-appearing 20 y.o.female in no apparent distress Neurologic: Awake, alert, oriented Psych: Bright affect, conversant Respiratory: Breathing even and unlabored. No stridor or wheezing Abdomen: Soft, nontender, nondistended to palpation. Incisions well-healed. No incisional hernias. Port easily palpated. Extremities: Atraumatic, good range of motion.  ASSESMENT: 21 y.o.  female  s/p AP-Standard lap-band.   PLAN: The patient's port was accessed with a 20G Huber needle without difficulty. Clear fluid was aspirated and 0.5 mL saline was added to the port. The patient was able to swallow water without difficulty following the procedure and was instructed to take clear liquids for the next 24-48 hours and advance slowly as tolerated.

## 2011-07-15 ENCOUNTER — Encounter (INDEPENDENT_AMBULATORY_CARE_PROVIDER_SITE_OTHER): Payer: BC Managed Care – PPO

## 2011-08-05 ENCOUNTER — Encounter (INDEPENDENT_AMBULATORY_CARE_PROVIDER_SITE_OTHER): Payer: BC Managed Care – PPO

## 2011-08-10 ENCOUNTER — Encounter (INDEPENDENT_AMBULATORY_CARE_PROVIDER_SITE_OTHER): Payer: Self-pay | Admitting: Surgery

## 2011-08-12 ENCOUNTER — Encounter (INDEPENDENT_AMBULATORY_CARE_PROVIDER_SITE_OTHER): Payer: Self-pay

## 2011-08-12 ENCOUNTER — Ambulatory Visit (INDEPENDENT_AMBULATORY_CARE_PROVIDER_SITE_OTHER): Payer: BC Managed Care – PPO | Admitting: Physician Assistant

## 2011-08-12 VITALS — BP 124/85 | Ht 64.0 in | Wt 303.2 lb

## 2011-08-12 DIAGNOSIS — Z4651 Encounter for fitting and adjustment of gastric lap band: Secondary | ICD-10-CM

## 2011-08-12 NOTE — Progress Notes (Signed)
  HISTORY: Marissa Miller is a 21 y.o.female who received an AP-Standard lap-band in September 2012 by Dr. Daphine Deutscher. She comes in with improvement in her hunger and portion sizes but she would like another adjustment to keep things on track. She has no regurgitation or reflux symptoms.  VITAL SIGNS: Filed Vitals:   08/12/11 1233  BP: 124/85    PHYSICAL EXAM: Physical exam reveals a very well-appearing 20 y.o.female in no apparent distress Neurologic: Awake, alert, oriented Psych: Bright affect, conversant Respiratory: Breathing even and unlabored. No stridor or wheezing Abdomen: Soft, nontender, nondistended to palpation. Incisions well-healed. No incisional hernias. Port easily palpated. Extremities: Atraumatic, good range of motion.  ASSESMENT: 21 y.o.  female  s/p AP-Standard lap-band.   PLAN: The patient's port was accessed with a 20G Huber needle without difficulty. Clear fluid was aspirated and 0.25 mL saline was added to the port. The patient was able to swallow water without difficulty following the procedure and was instructed to take clear liquids for the next 24-48 hours and advance slowly as tolerated.

## 2011-08-12 NOTE — Patient Instructions (Signed)
Take clear liquids tonight. Thin protein shakes are ok to start tomorrow morning. Slowly advance your diet thereafter. Call us if you have persistent vomiting or regurgitation, night cough or reflux symptoms. Return as scheduled or sooner if you notice no changes in hunger/portion sizes.  

## 2011-09-16 ENCOUNTER — Encounter (INDEPENDENT_AMBULATORY_CARE_PROVIDER_SITE_OTHER): Payer: Self-pay

## 2011-09-16 ENCOUNTER — Ambulatory Visit (INDEPENDENT_AMBULATORY_CARE_PROVIDER_SITE_OTHER): Payer: BC Managed Care – PPO | Admitting: Surgery

## 2011-09-16 NOTE — Progress Notes (Signed)
  HISTORY: Marissa Miller is a 21 y.o.female who received an AP-Standard lap-band in September 2012 by Dr. Daphine Deutscher. She comes in having lost 15 lbs in one month. She denies regurgitation symptoms. She reports no increase in hunger and she says her portion sizes are small. She denies feeling tempted to go back for second helpings after a meal. She's continuing her exercise program.  VITAL SIGNS: Filed Vitals:   09/16/11 1222  BP: 144/86  Pulse: 80  Temp: 97.6 F (36.4 C)  Resp: 16    PHYSICAL EXAM: Physical exam reveals a very well-appearing 20 y.o.female in no apparent distress Neurologic: Awake, alert, oriented Psych: Bright affect, conversant Respiratory: Breathing even and unlabored. No stridor or wheezing Extremities: Atraumatic, good range of motion. Skin: Warm, Dry, no rashes Musculoskeletal: Normal gait, Joints normal  ASSESMENT: 20 y.o.  female  s/p AP-Standard lap-band.   PLAN: As she's having good success with weight loss in the green zone, we opted to defer an adjustment today. We'll have her come back in a month or sooner if necessary.

## 2011-09-16 NOTE — Patient Instructions (Signed)
Return in one month or sooner if needed

## 2011-10-21 ENCOUNTER — Encounter (INDEPENDENT_AMBULATORY_CARE_PROVIDER_SITE_OTHER): Payer: Self-pay

## 2011-10-21 ENCOUNTER — Ambulatory Visit (INDEPENDENT_AMBULATORY_CARE_PROVIDER_SITE_OTHER): Payer: BC Managed Care – PPO | Admitting: Physician Assistant

## 2011-10-21 VITALS — BP 142/82 | HR 76 | Resp 18 | Ht 64.0 in | Wt 288.5 lb

## 2011-10-21 DIAGNOSIS — Z4651 Encounter for fitting and adjustment of gastric lap band: Secondary | ICD-10-CM

## 2011-10-21 NOTE — Patient Instructions (Signed)
Take clear liquids tonight. Thin protein shakes are ok to start tomorrow morning. Slowly advance your diet thereafter. Call us if you have persistent vomiting or regurgitation, night cough or reflux symptoms. Return as scheduled or sooner if you notice no changes in hunger/portion sizes.  

## 2011-10-21 NOTE — Progress Notes (Signed)
  HISTORY: Marissa Miller is a 21 y.o.female who received an AP-Standard lap-band in September 2012 by Dr. Daphine Deutscher. She comes in with no weight loss and increasing hunger/portion sizes. No reflux or regurgitation.  VITAL SIGNS: Filed Vitals:   10/21/11 1201  BP: 142/82  Pulse: 76  Resp: 18    PHYSICAL EXAM: Physical exam reveals a very well-appearing 20 y.o.female in no apparent distress Neurologic: Awake, alert, oriented Psych: Bright affect, conversant Respiratory: Breathing even and unlabored. No stridor or wheezing Abdomen: Soft, nontender, nondistended to palpation. Incisions well-healed. No incisional hernias. Port easily palpated. Extremities: Atraumatic, good range of motion.  ASSESMENT: 21 y.o.  female  s/p AP-Standard lap-band.   PLAN: The patient's port was accessed with a 20G Huber needle without difficulty. Clear fluid was aspirated and 0.5 mL saline was added to the port. The patient was able to swallow water without difficulty following the procedure and was instructed to take clear liquids for the next 24-48 hours and advance slowly as tolerated.

## 2011-11-25 ENCOUNTER — Encounter (INDEPENDENT_AMBULATORY_CARE_PROVIDER_SITE_OTHER): Payer: Self-pay

## 2011-11-25 ENCOUNTER — Ambulatory Visit (INDEPENDENT_AMBULATORY_CARE_PROVIDER_SITE_OTHER): Payer: BC Managed Care – PPO | Admitting: Physician Assistant

## 2011-11-25 VITALS — BP 132/84 | Ht 64.0 in | Wt 270.0 lb

## 2011-11-25 DIAGNOSIS — Z4651 Encounter for fitting and adjustment of gastric lap band: Secondary | ICD-10-CM

## 2011-11-25 NOTE — Progress Notes (Signed)
  HISTORY: Marissa Miller is a 21 y.o.female who received an AP-Standard lap-band in September 2012 by Dr. Daphine Deutscher. She's lost 19 lbs since her last visit. She denies persistent regurgitation or reflux symptoms. She notes a slight increase in her appetite and portion sizes since being in the clinic last. She'd like an adjustment today.  VITAL SIGNS: Filed Vitals:   11/25/11 1204  BP: 132/84    PHYSICAL EXAM: Physical exam reveals a very well-appearing 20 y.o.female in no apparent distress Neurologic: Awake, alert, oriented Psych: Bright affect, conversant Respiratory: Breathing even and unlabored. No stridor or wheezing Abdomen: Soft, nontender, nondistended to palpation. Incisions well-healed. No incisional hernias. Port easily palpated. Extremities: Atraumatic, good range of motion.  ASSESMENT: 21 y.o.  female  s/p AP-Standard lap-band.   PLAN: The patient's port was accessed with a 20G Huber needle without difficulty. Clear fluid was aspirated and 0.25 mL saline was added to the port. The patient was able to swallow water without difficulty following the procedure and was instructed to take clear liquids for the next 24-48 hours and advance slowly as tolerated.

## 2011-11-25 NOTE — Patient Instructions (Signed)
Take clear liquids tonight. Thin protein shakes are ok to start tomorrow morning. Slowly advance your diet thereafter. Call us if you have persistent vomiting or regurgitation, night cough or reflux symptoms. Return as scheduled or sooner if you notice no changes in hunger/portion sizes.  

## 2012-04-30 IMAGING — US US ABDOMEN COMPLETE
1 series · 14 of 25 positions shown · non-contrast
Comparison: None.

CLINICAL DATA: Morbid obesity.  Bariatric screening evaluation

COMPLETE ABDOMINAL ULTRASOUND

[Series 1: us abdomen complete · 14 of 71 slices shown]
[im 1/71]
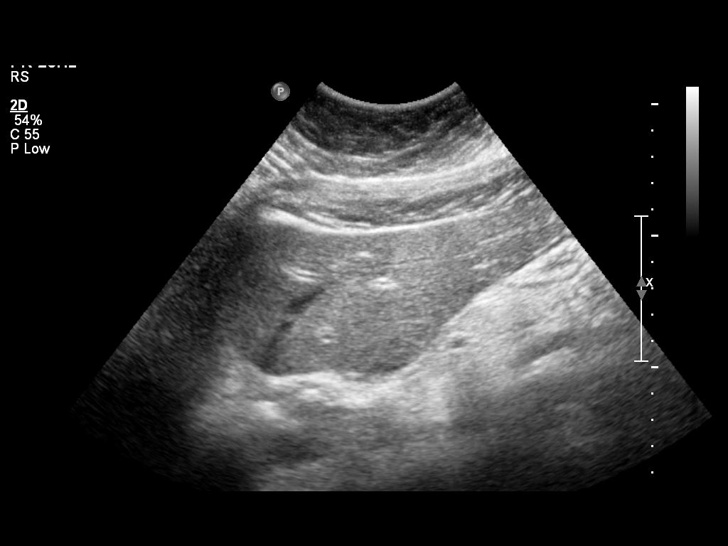
[im 6/71]
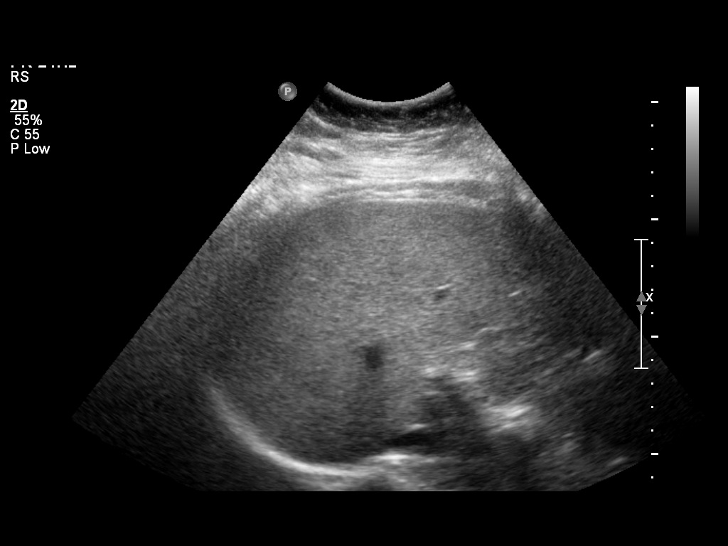
[im 12/71]
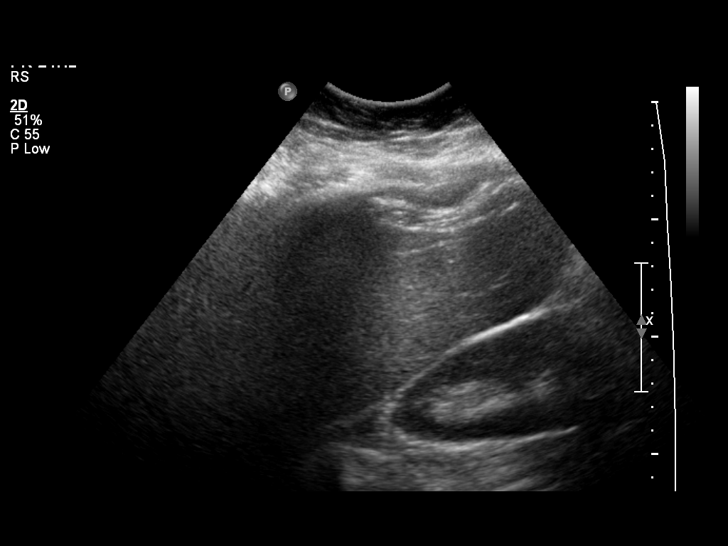
[im 18/71]
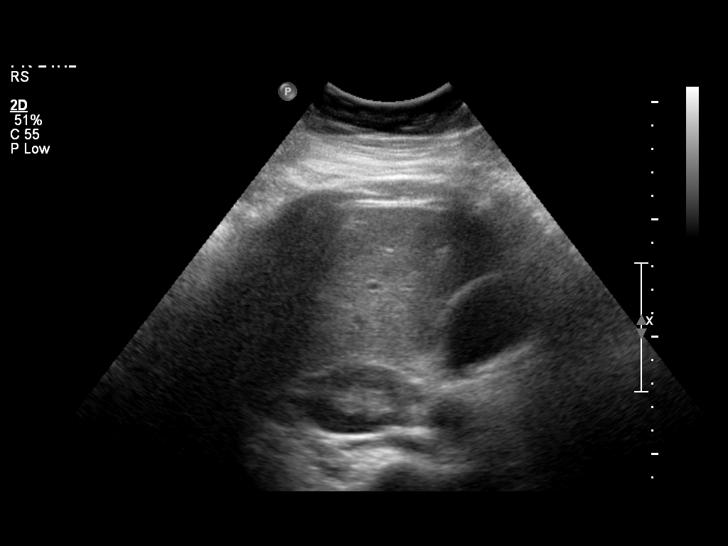
[im 24/71]
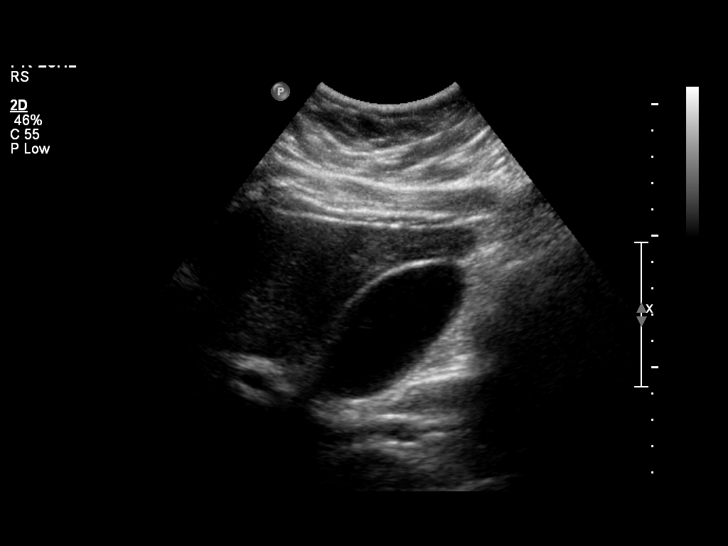
[im 27/71]
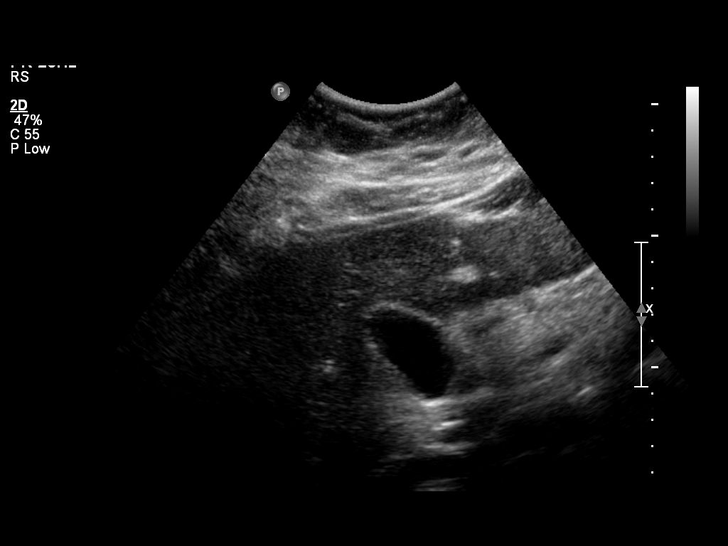
[im 33/71]
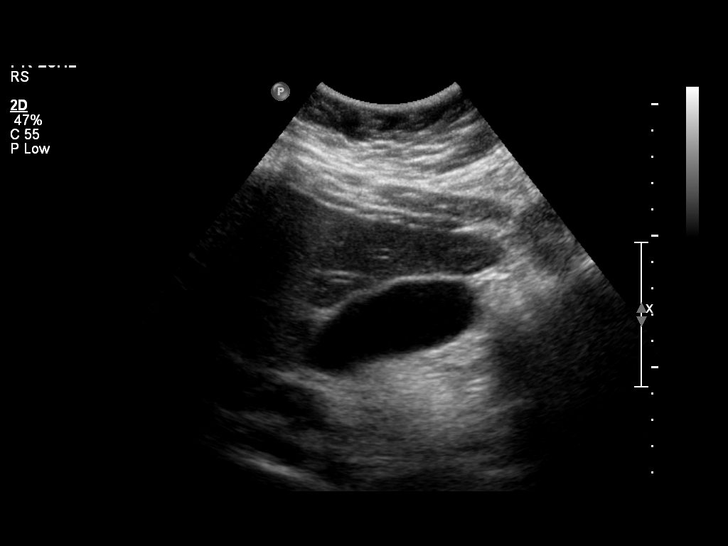
[im 38/71]
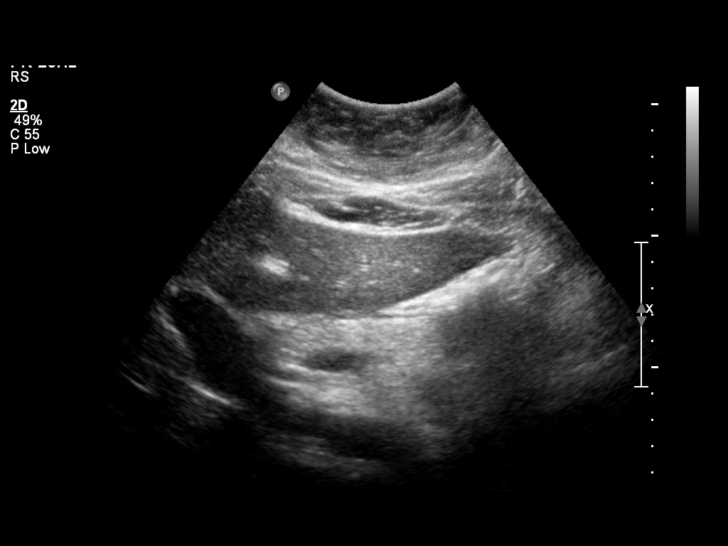
[im 44/71]
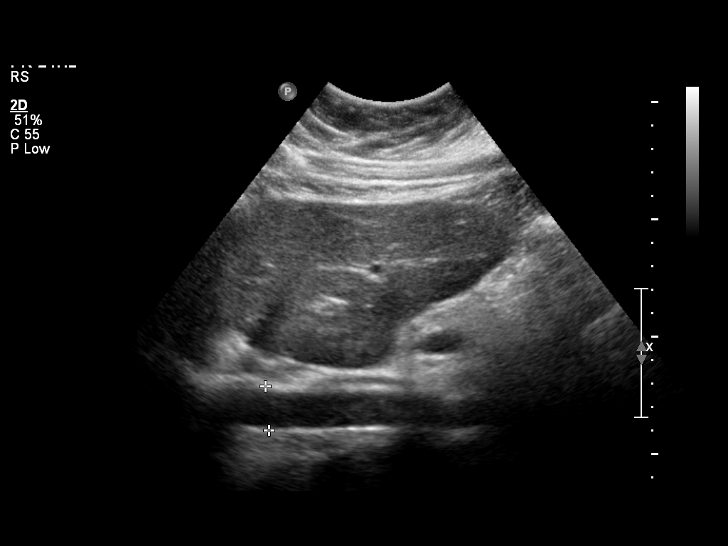
[im 47/71]
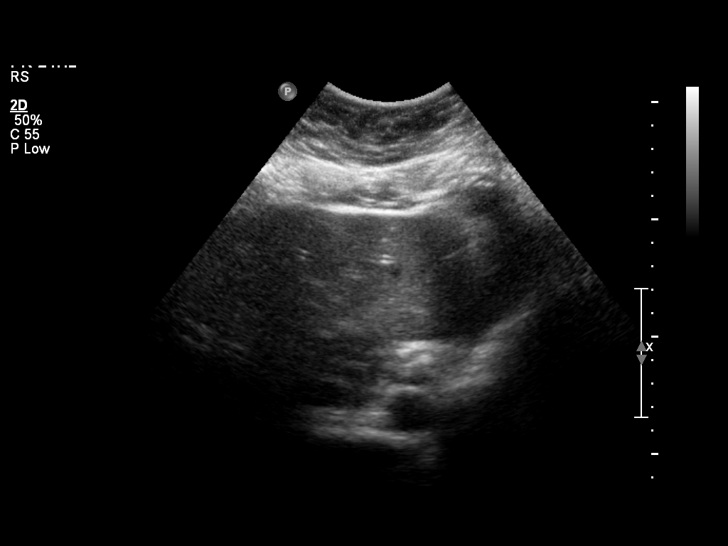
[im 53/71]
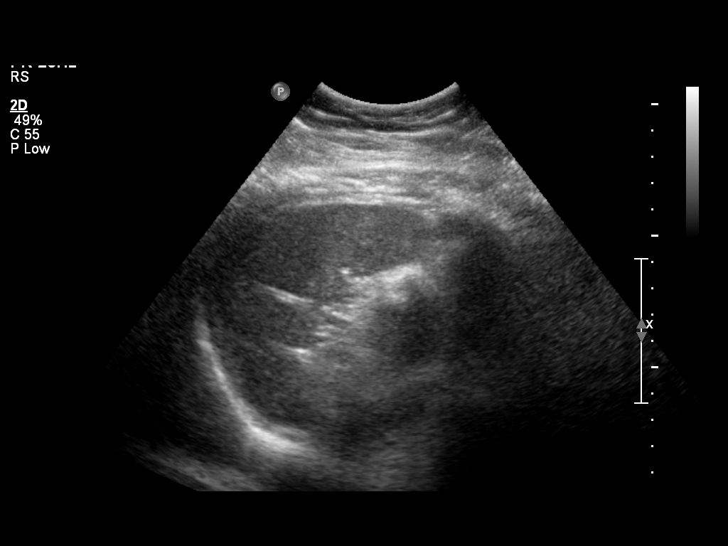
[im 59/71]
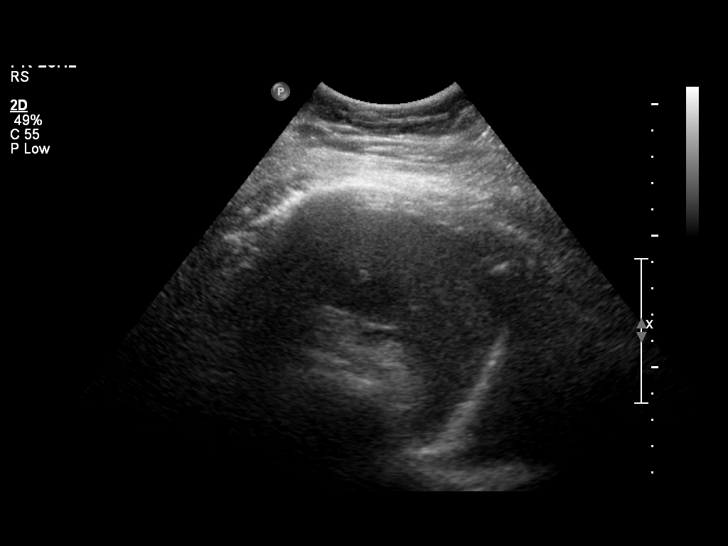
[im 65/71]
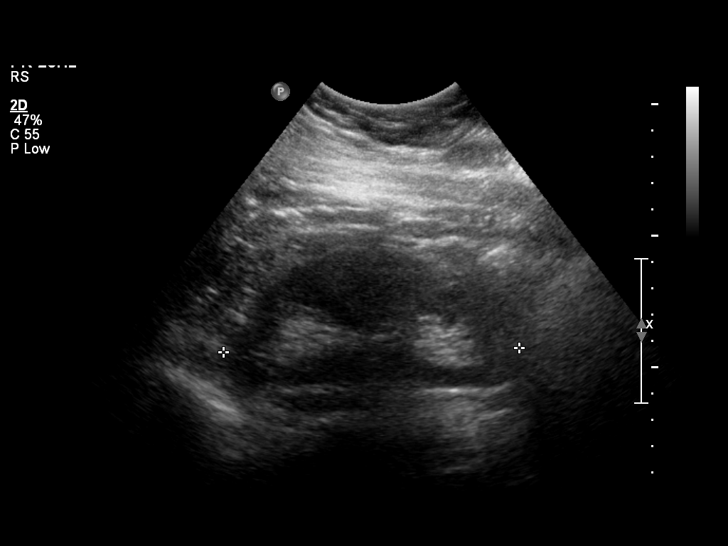
[im 71/71]
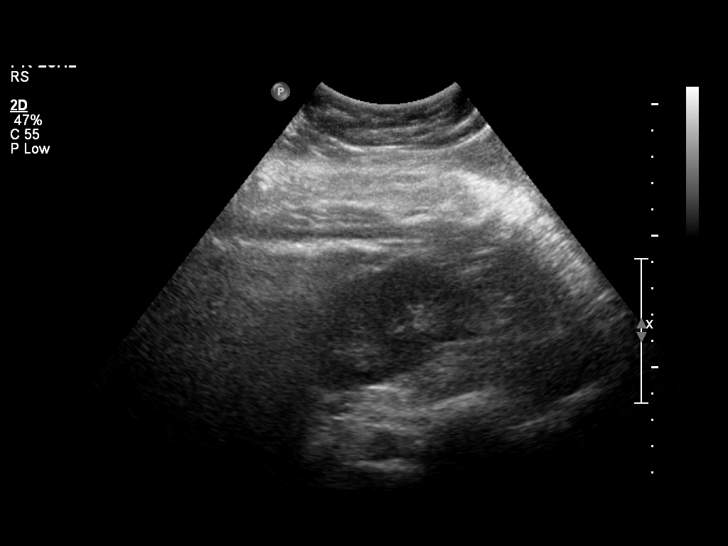

[14 of 25 positions shown; findings below may reference images not displayed]

FINDINGS: Gallbladder:  No gallstones, gallbladder wall thickening, or
pericholecystic fluid.

Common bile duct:  Measures 3.8 mm in depth and has a normal
appearance

Liver:  No focal lesion identified.  Within normal limits in
parenchymal echogenicity. No signs of intrahepatic ductal
dilatation are seen

IVC:  Appears normal.

Pancreas:  No focal abnormality seen in the body or head.  The
pancreatic tail is obscured by overlying gas.

Spleen:  Has a sagittal length of 9.4 cm with no focal parenchymal
abnormality

Right Kidney:  Has a sagittal length of 9.6 cm.  No focal
parenchymal abnormality or signs of hydronephrosis are seen

Left Kidney:  Has a sagittal length of 11.2 cm.  No focal
parenchymal abnormality or signs of hydronephrosis are seen.

Abdominal aorta:  Demonstrates a caliber of 2.2 cm with no
aneurysmal dilatation seen
IMPRESSION: Negative abdominal ultrasound.

## 2012-05-04 ENCOUNTER — Encounter (INDEPENDENT_AMBULATORY_CARE_PROVIDER_SITE_OTHER): Payer: BC Managed Care – PPO

## 2012-06-03 ENCOUNTER — Encounter (HOSPITAL_COMMUNITY): Payer: Self-pay | Admitting: Emergency Medicine

## 2012-06-03 ENCOUNTER — Emergency Department (INDEPENDENT_AMBULATORY_CARE_PROVIDER_SITE_OTHER)
Admission: EM | Admit: 2012-06-03 | Discharge: 2012-06-03 | Disposition: A | Discharge status: Home / Self Care | Payer: BC Managed Care – PPO | Source: Home / Self Care

## 2012-06-03 DIAGNOSIS — J020 Streptococcal pharyngitis: Secondary | ICD-10-CM

## 2012-06-03 DIAGNOSIS — I10 Essential (primary) hypertension: Secondary | ICD-10-CM

## 2012-06-03 DIAGNOSIS — J039 Acute tonsillitis, unspecified: Secondary | ICD-10-CM

## 2012-06-03 DIAGNOSIS — B9789 Other viral agents as the cause of diseases classified elsewhere: Secondary | ICD-10-CM

## 2012-06-03 DIAGNOSIS — J029 Acute pharyngitis, unspecified: Secondary | ICD-10-CM

## 2012-06-03 LAB — POCT RAPID STREP A: Streptococcus, Group A Screen (Direct): POSITIVE — AB

## 2012-06-03 MED ORDER — AZITHROMYCIN 250 MG PO TABS: ORAL_TABLET | ORAL | Status: DC

## 2012-06-03 MED ORDER — AMOXICILLIN 875 MG PO TABS: 875.0000 mg | ORAL_TABLET | Freq: Two times a day (BID) | ORAL | Status: DC

## 2012-06-03 NOTE — ED Provider Notes (Signed)
History     CSN: [id number]  Arrival date & time 06/03/12  1101   First MD Initiated Contact with Patient 06/03/12 1111      Chief Complaint  Patient presents with  . Sore Throat    (Consider location/radiation/quality/duration/timing/severity/associated sxs/prior treatment) Patient is a 22 y.o. female presenting with pharyngitis.  Sore Throat  This is a 22 y/o female who tells me that she has had a sore throat since last night and feels that her throat is closing up on her. No cough, fevers, chills, sweats, sinus drainage, ear ache. She has a mild stuffy nose with clear drainage from nose. No generalized body aches.   Past Medical History  Diagnosis Date  . Morbid obesity   . Pre-diabetes   . Hypertension   . PCOS (polycystic ovarian syndrome)   . Allergic rhinitis   . OSA (obstructive sleep apnea) 11/03/2010  . Sinus problem   . Bleeding nose   . Contact lens/glasses fitting   . Change in weight     Past Surgical History  Procedure Laterality Date  . Laparoscopic gastric banding  11/10/10    Family History  Problem Relation Age of Onset  . Diabetes Maternal Grandfather     History  Substance Use Topics  . Smoking status: Never Smoker   . Smokeless tobacco: Never Used  . Alcohol Use: 0.0 oz/week    1-2 Glasses of wine per week     Comment: socially    OB History   Grav Para Term Preterm Abortions TAB SAB Ect Mult Living                  Review of Systems  Constitutional: Negative.   HENT: Positive for sore throat, trouble swallowing and neck pain.   Eyes: Negative.   Respiratory: Negative.   Cardiovascular: Negative.   Gastrointestinal: Negative.   Endocrine: Negative.   Genitourinary: Negative.   Musculoskeletal: Negative for myalgias and arthralgias.  Skin: Negative.   Neurological: Negative.   Hematological: Negative.   Psychiatric/Behavioral: Negative.     Allergies  Review of patient's allergies indicates no known allergies.  Home  Medications   Current Outpatient Rx  Name  Route  Sig  Dispense  Refill  . metFORMIN (GLUMETZA) 500 MG (MOD) 24 hr tablet   Oral   Take 500 mg by mouth 2 (two) times daily.          . calcium citrate-vitamin D 200-200 MG-UNIT TABS   Oral   Take 4 tablets by mouth daily.           . IBUPROFEN PO   Oral   Take by mouth as needed.           . Multiple Vitamins-Minerals (MULTIVITAMIN WITH MINERALS) tablet   Oral   Take 1 tablet by mouth daily.           . norgestimate-ethinyl estradiol (ORTHO-CYCLEN,SPRINTEC,PREVIFEM) 0.25-35 MG-MCG tablet   Oral   Take 1 tablet by mouth daily.             BP 151/92  Pulse 80  Temp(Src) 98.4 F (36.9 C) (Oral)  Resp 12  SpO2 100%  LMP 05/14/2012  Physical Exam  Constitutional: She is oriented to person, place, and time. She appears well-developed and well-nourished.  HENT:  Head: Normocephalic and atraumatic.  Right Ear: External ear normal.  Left Ear: External ear normal.  Mouth/Throat: Oropharyngeal exudate present.  Mild b/l tonsillar enlargement without any noted pus  Eyes: Conjunctivae  and EOM are normal. Pupils are equal, round, and reactive to light. Right eye exhibits discharge. Left eye exhibits no discharge.  Neck: Normal range of motion. Neck supple.  Cardiovascular: Normal rate and regular rhythm.   Pulmonary/Chest: Effort normal and breath sounds normal. No stridor.  Abdominal: Soft. Bowel sounds are normal.  Musculoskeletal: Normal range of motion.  Neurological: She is alert and oriented to person, place, and time.  Skin: Skin is warm and dry.  Psychiatric: She has a normal mood and affect.    ED Course  Procedures (including critical care time)  Labs Reviewed  POCT RAPID STREP A ([facility] ONLY) - Abnormal; Notable for the following:    Streptococcus, Group A Screen (Direct) POSITIVE (*)    All other components within normal limits   No results found.   1. Acute tonsillitis   2. Pharyngitis with  viral syndrome   3. HTN (hypertension)       MDM  Likley viral infection.  Ibuprofen for pain, saline gargles, warm liquids and other conservative treatments.  For HTN, please see your PCP ASAP.         Calvert Cantor, MD 06/03/12 1141

## 2012-06-03 NOTE — ED Notes (Signed)
Pt c/o sore throat onset yest Sx include: dysphagia, runny nose Denies: f/v/n/d, cough  She is alert and oriented w/no signs of acute distress.

## 2012-08-12 IMAGING — CR DG ABDOMEN 1V
2 series · 2 of 2 positions shown · non-contrast
Comparison: None.

CLINICAL DATA: Postop gastric banding.

ABDOMEN - 1 VIEW

[t abdomen supine (1 of 2)]
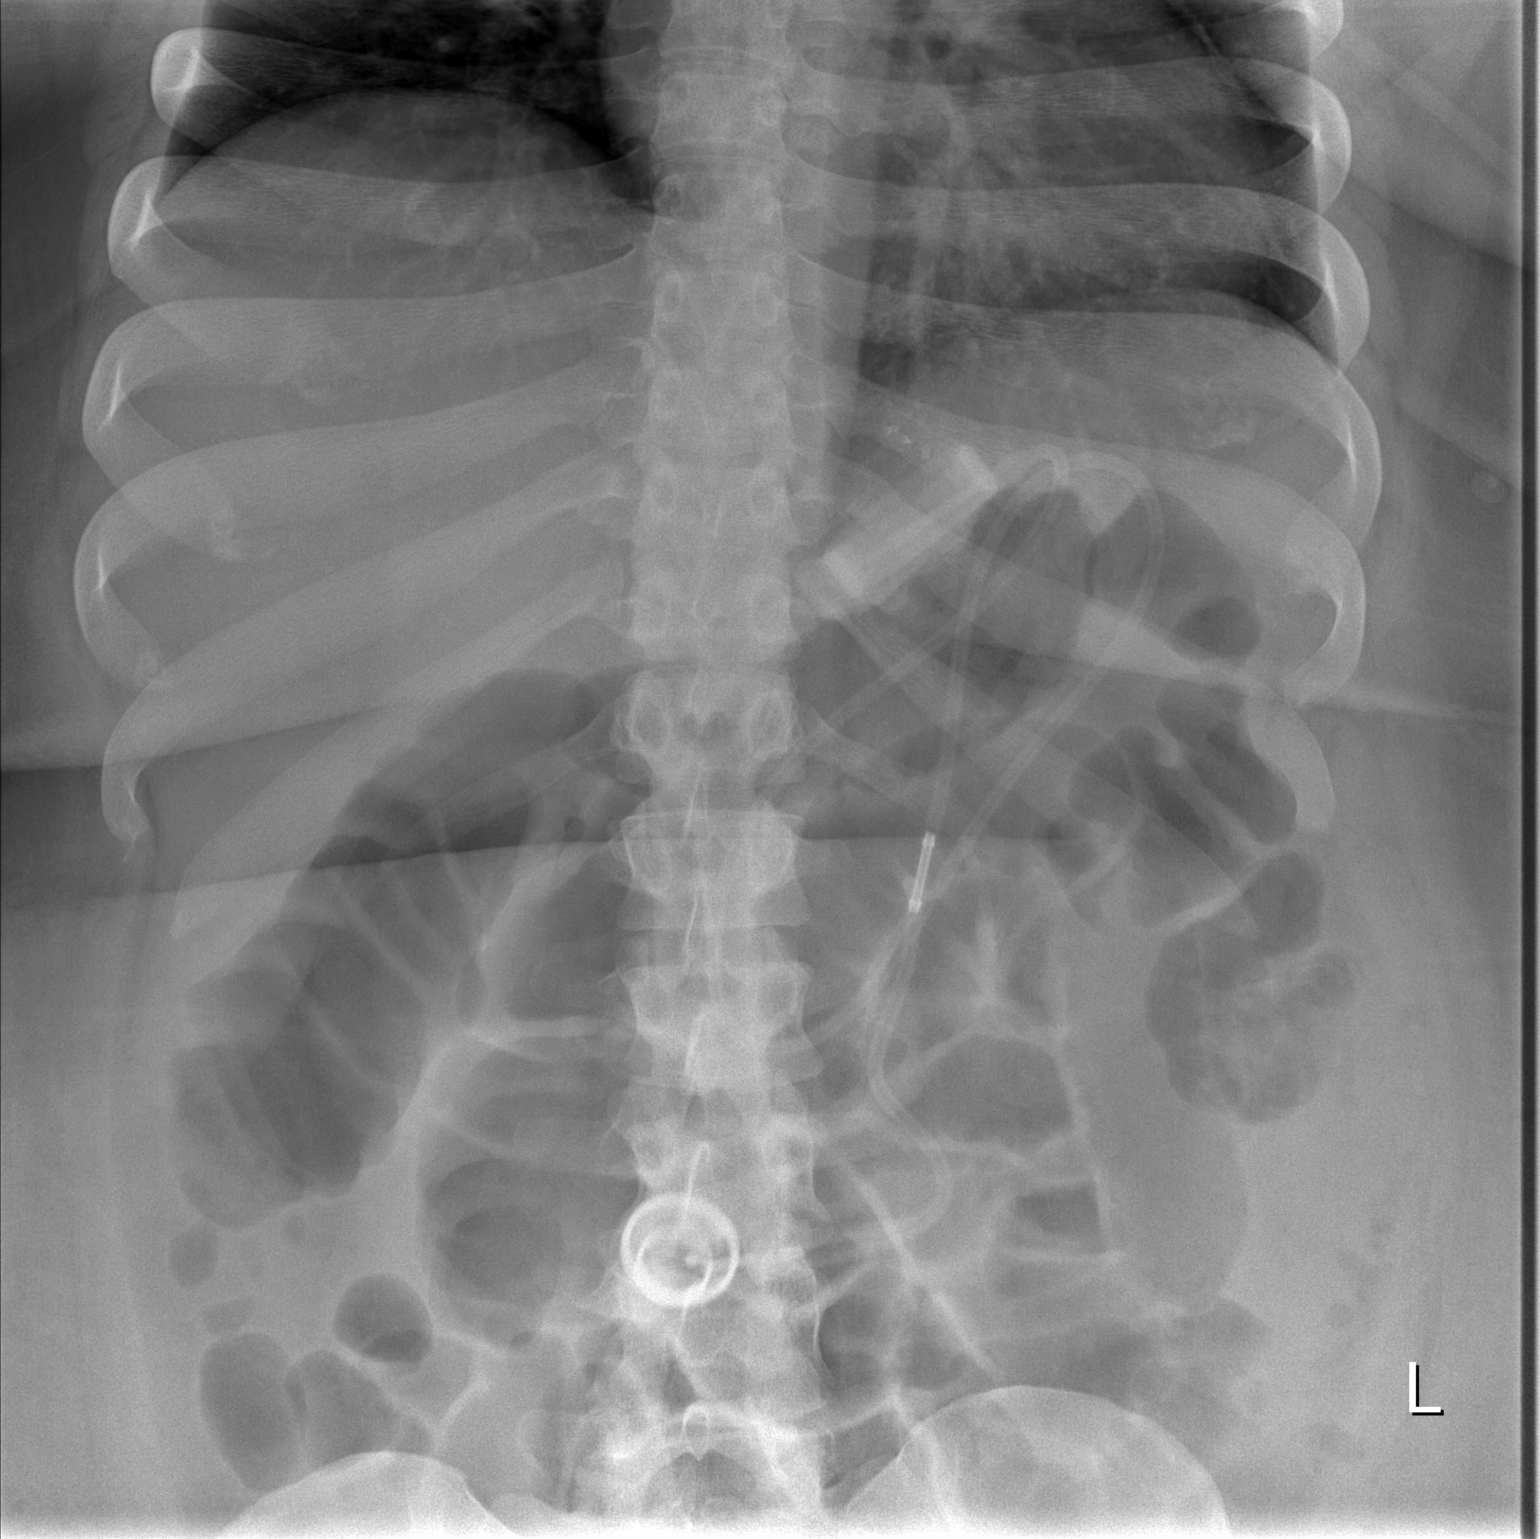

[t abdomen supine (2 of 2)]
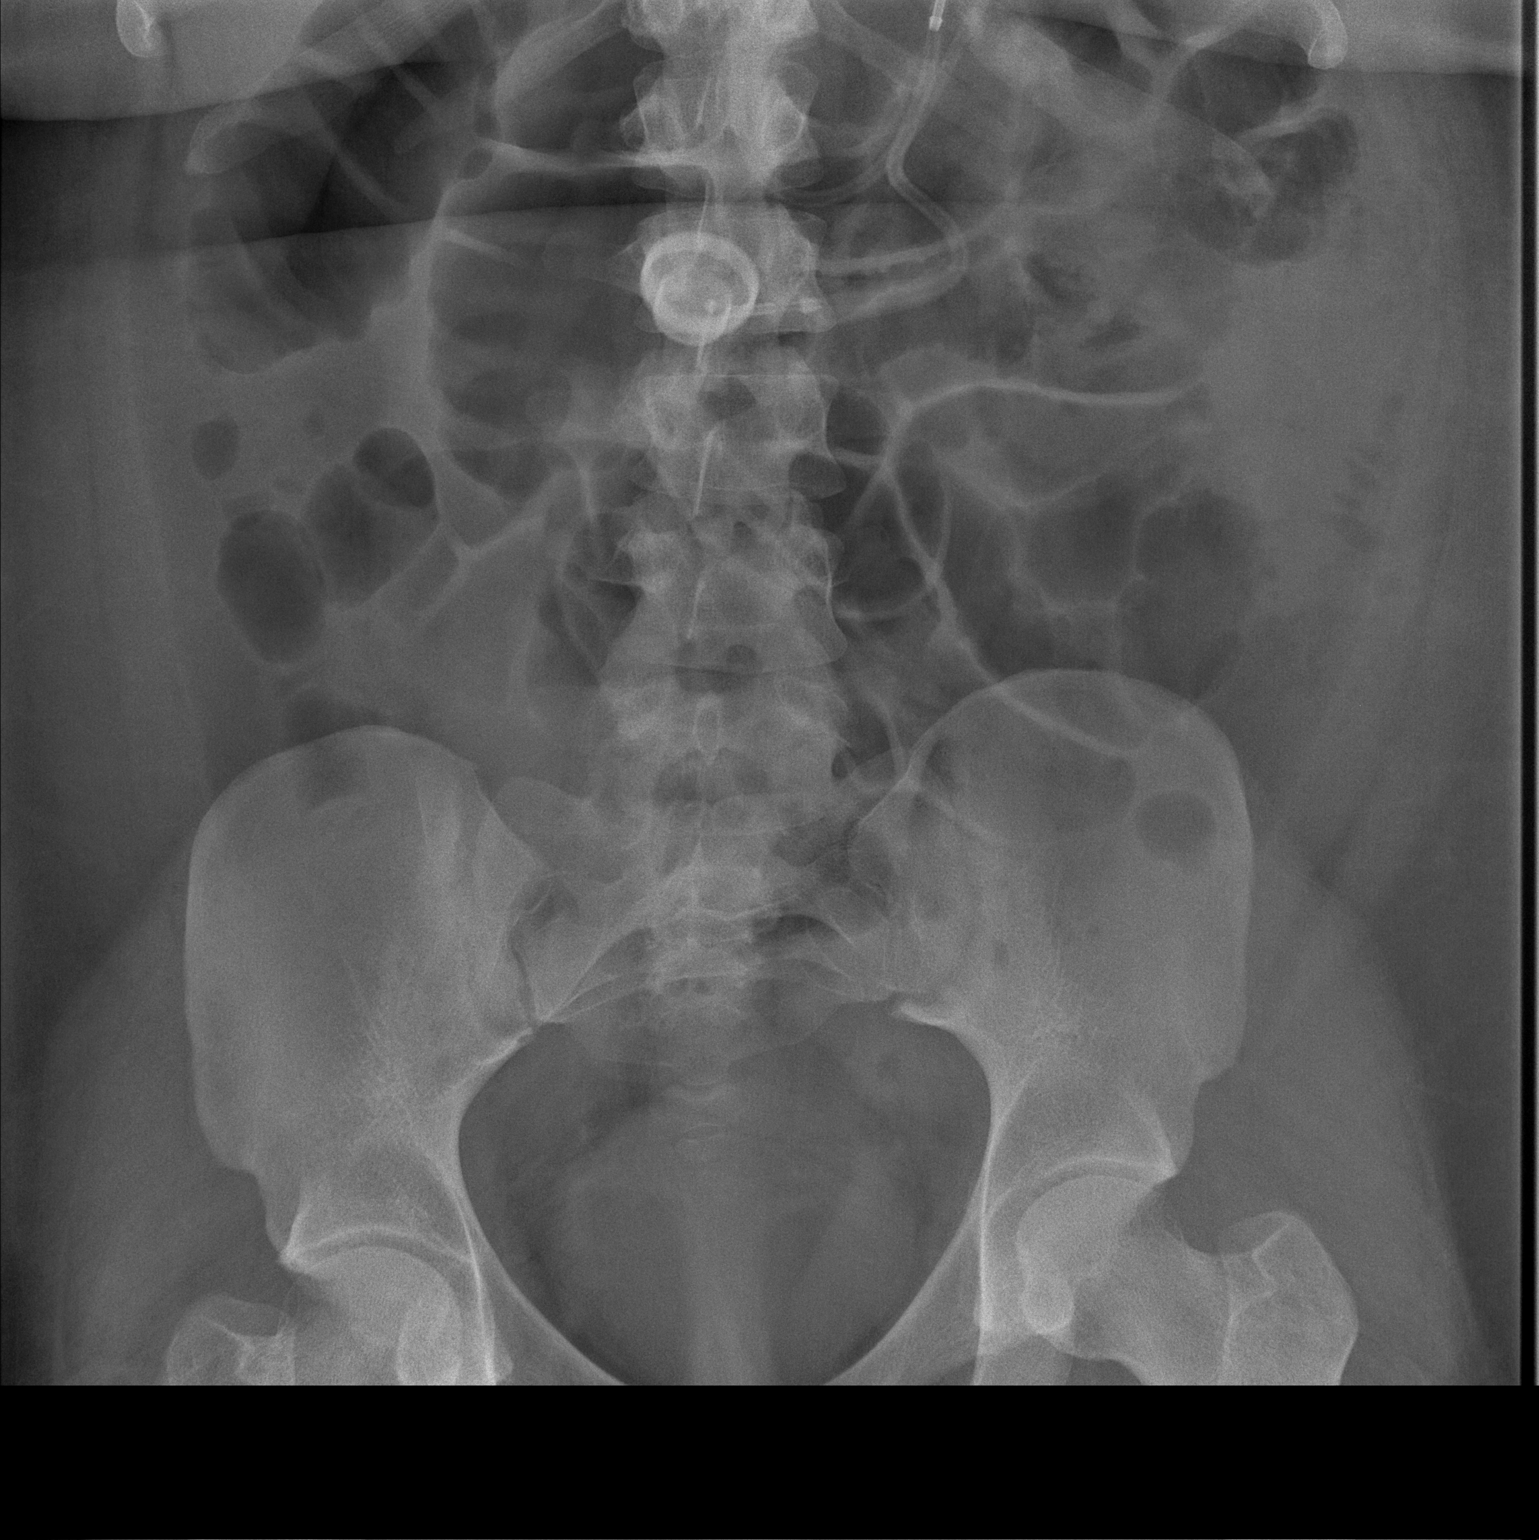

[2 of 2 positions shown; findings below may reference images not displayed]

FINDINGS: Gastric band is seen in the epigastric region, at the 2
o'clock and 8 o'clock positions.  The port projects over the lumbar
spine.  There is gaseous distention of bowel.
IMPRESSION: 1.  Gastric banding without complicating feature.
2.  Gaseous distention of bowel.

## 2015-12-05 ENCOUNTER — Encounter (HOSPITAL_COMMUNITY): Payer: Self-pay

## 2015-12-15 ENCOUNTER — Telehealth (HOSPITAL_COMMUNITY): Payer: Self-pay

## 2016-01-02 NOTE — Telephone Encounter (Signed)
This patient is overdue for recommended follow-up with a [facility]ric surgeon [facility]. A letter was mailed to the address on file on 9.29.17 from both [place] & [facility] in attempt to reestablish post-op care. Letter has been returned to [employer] marked undeliverable, unable to forward. No additional address [facility] or [employer]. Information was shared with Dario GuardianFrances Jackson today at [facility] so she may contact the patient via phone in attempt to get her scheduled for an appointment in their office.   Jim LikeAmanda T. [facility] Coordinator [phone]

## 2016-02-27 ENCOUNTER — Ambulatory Visit (HOSPITAL_COMMUNITY)
Admission: EM | Admit: 2016-02-27 | Discharge: 2016-02-27 | Disposition: A | Discharge status: Home / Self Care | Payer: Self-pay | Attending: Family Medicine | Admitting: Family Medicine

## 2016-02-27 ENCOUNTER — Encounter (HOSPITAL_COMMUNITY): Payer: Self-pay | Admitting: Family Medicine

## 2016-02-27 DIAGNOSIS — Z79899 Other long term (current) drug therapy: Secondary | ICD-10-CM | POA: Insufficient documentation

## 2016-02-27 DIAGNOSIS — B373 Candidiasis of vulva and vagina: Secondary | ICD-10-CM

## 2016-02-27 DIAGNOSIS — B3731 Acute candidiasis of vulva and vagina: Secondary | ICD-10-CM

## 2016-02-27 DIAGNOSIS — N760 Acute vaginitis: Secondary | ICD-10-CM | POA: Insufficient documentation

## 2016-02-27 DIAGNOSIS — Z7984 Long term (current) use of oral hypoglycemic drugs: Secondary | ICD-10-CM | POA: Insufficient documentation

## 2016-02-27 MED ORDER — TERCONAZOLE 80 MG VA SUPP: 80.0000 mg | Freq: Every day | VAGINAL | 0 refills | Status: AC

## 2016-02-27 MED ORDER — FLUCONAZOLE 150 MG PO TABS
150.0000 mg | ORAL_TABLET | Freq: Once | ORAL | 1 refills | Status: AC
Start: 2016-02-27 — End: 2016-02-27

## 2016-02-27 NOTE — ED Triage Notes (Signed)
Pt here for vaginal irritation, white discharge and swelling with redness.

## 2016-02-27 NOTE — Discharge Instructions (Signed)
We will call with positive test results and treat as indicated  °

## 2016-02-27 NOTE — ED Provider Notes (Signed)
[facility]    CSN: [id number] Arrival date & time: 02/27/16  1751     History   Chief Complaint Chief Complaint  Patient presents with  . Vaginitis    HPI Marissa Miller is a 25 y.o. female.   The history is provided by the patient.  Female GU Problem  This is a new problem. The current episode started more than 2 days ago. Pertinent negatives include no chest pain, no abdominal pain, no headaches and no shortness of breath.    Past Medical History:  Diagnosis Date  . Allergic rhinitis   . Bleeding nose   . Change in weight   . Contact lens/glasses fitting   . Hypertension   . Morbid obesity (HCC)   . OSA (obstructive sleep apnea) 11/03/2010  . PCOS (polycystic ovarian syndrome)   . Pre-diabetes   . Sinus problem     Patient Active Problem List   Diagnosis Date Noted  . History of laparoscopic adjustable gastric banding 02/24/2011  . OSA (obstructive sleep apnea) 11/03/2010  . Morbid obesity (HCC) 10/15/2010  . HTN (hypertension) 10/15/2010  . Pre-diabetes 10/15/2010    Past Surgical History:  Procedure Laterality Date  . LAPAROSCOPIC GASTRIC BANDING  11/10/10    OB History    No data available       Home Medications    Prior to Admission medications   Medication Sig Start Date End Date Taking? Authorizing Provider  amoxicillin (AMOXIL) 875 MG tablet Take 1 tablet (875 mg total) by mouth 2 (two) times daily. 06/03/12   Calvert CantorSaima Rizwan, MD  calcium citrate-vitamin D 200-200 MG-UNIT TABS Take 4 tablets by mouth daily.      Historical Provider, MD  IBUPROFEN PO Take by mouth as needed.      Historical Provider, MD  metFORMIN (GLUMETZA) 500 MG (MOD) 24 hr tablet Take 500 mg by mouth 2 (two) times daily.     Historical Provider, MD  Multiple Vitamins-Minerals (MULTIVITAMIN WITH MINERALS) tablet Take 1 tablet by mouth daily.      Historical Provider, MD  norgestimate-ethinyl estradiol (ORTHO-CYCLEN,SPRINTEC,PREVIFEM) 0.25-35 MG-MCG tablet Take 1  tablet by mouth daily.      Historical Provider, MD    Family History Family History  Problem Relation Age of Onset  . Diabetes Maternal Grandfather     Social History Social History  Substance Use Topics  . Smoking status: Never Smoker  . Smokeless tobacco: Never Used  . Alcohol use 0.0 oz/week    1 - 2 Glasses of wine per week     Comment: socially     Allergies   Patient has no known allergies.   Review of Systems Review of Systems  Constitutional: Negative.   Respiratory: Negative for shortness of breath.   Cardiovascular: Negative for chest pain.  Gastrointestinal: Negative.  Negative for abdominal pain.  Genitourinary: Positive for vaginal discharge. Negative for dysuria, frequency, pelvic pain, urgency, vaginal bleeding and vaginal pain.  Neurological: Negative for headaches.  All other systems reviewed and are negative.    Physical Exam Triage Vital Signs ED Triage Vitals [02/27/16 1810]  Enc Vitals Group     BP (!) 152/101     Pulse Rate 103     Resp 18     Temp 98.4 F (36.9 C)     Temp src      SpO2 98 %     Weight      Height      Head Circumference  Peak Flow      Pain Score      Pain Loc      Pain Edu?      Excl. in GC?    No data found.   Updated Vital Signs BP (!) 152/101   Pulse 103   Temp 98.4 F (36.9 C)   Resp 18   LMP 02/25/2016   SpO2 98%   Visual Acuity Right Eye Distance:   Left Eye Distance:   Bilateral Distance:    Right Eye Near:   Left Eye Near:    Bilateral Near:     Physical Exam  Constitutional: She appears well-developed and well-nourished.  Cardiovascular: Normal rate, regular rhythm, normal heart sounds and intact distal pulses.   Pulmonary/Chest: Effort normal and breath sounds normal.  Abdominal: Soft. Bowel sounds are normal. She exhibits no mass. There is no tenderness. No hernia.  Skin: Skin is warm and dry.  Nursing note and vitals reviewed.    UC Treatments / Results  Labs (all labs  ordered are listed, but only abnormal results are displayed) Labs Reviewed  URINE CYTOLOGY ANCILLARY ONLY    EKG  EKG Interpretation None       Radiology No results found.  Procedures Procedures (including critical care time)  Medications Ordered in UC Medications - No data to display   Initial Impression / Assessment and Plan / UC Course  I have reviewed the triage vital signs and the nursing notes.  Pertinent labs & imaging results that were available during my care of the patient were reviewed by me and considered in my medical decision making (see chart for details).  Clinical Course       Final Clinical Impressions(s) / UC Diagnoses   Final diagnoses:  None    New Prescriptions New Prescriptions   No medications on file     Linna HoffJames D Temesha Queener, MD 02/27/16 [phone]

## 2016-03-02 LAB — URINE CYTOLOGY ANCILLARY ONLY
CHLAMYDIA, DNA PROBE: NEGATIVE
NEISSERIA GONORRHEA: NEGATIVE
Trichomonas: POSITIVE — AB

## 2016-03-04 ENCOUNTER — Telehealth: Payer: Self-pay | Admitting: Internal Medicine

## 2016-03-04 LAB — URINE CYTOLOGY ANCILLARY ONLY: Candida vaginitis: NEGATIVE

## 2016-03-04 MED ORDER — METRONIDAZOLE 500 MG PO TABS: 2000.0000 mg | ORAL_TABLET | Freq: Once | ORAL | 0 refills | Status: AC

## 2016-03-04 NOTE — Telephone Encounter (Signed)
Clinical staff, please let patient know that test for trichomonas was positive.  Rx metronidazole sent to pharmacy of record, [facility] [address] [facility] [place].  Recheck or followup with primary care provider for further evaluation if symptoms persist.  LM

## 2016-06-08 ENCOUNTER — Encounter (HOSPITAL_COMMUNITY): Payer: Self-pay

## 2017-06-08 ENCOUNTER — Encounter (HOSPITAL_COMMUNITY): Payer: Self-pay

## 2019-12-12 ENCOUNTER — Ambulatory Visit: Payer: Self-pay | Admitting: Physician Assistant

## 2019-12-12 ENCOUNTER — Other Ambulatory Visit: Payer: Self-pay

## 2019-12-12 DIAGNOSIS — Z113 Encounter for screening for infections with a predominantly sexual mode of transmission: Secondary | ICD-10-CM

## 2019-12-12 DIAGNOSIS — Z299 Encounter for prophylactic measures, unspecified: Secondary | ICD-10-CM

## 2019-12-12 DIAGNOSIS — B373 Candidiasis of vulva and vagina: Secondary | ICD-10-CM

## 2019-12-12 DIAGNOSIS — B3731 Acute candidiasis of vulva and vagina: Secondary | ICD-10-CM

## 2019-12-12 LAB — WET PREP FOR TRICH, YEAST, CLUE: Trichomonas Exam: NEGATIVE

## 2019-12-12 MED ORDER — CLOTRIMAZOLE 1 % VA CREA: 1.0000 | TOPICAL_CREAM | Freq: Every day | VAGINAL | 0 refills | Status: AC

## 2019-12-12 MED ORDER — AZITHROMYCIN 500 MG PO TABS
1000.0000 mg | ORAL_TABLET | Freq: Once | ORAL | Status: AC
  Administered 2019-12-12: 1000 mg via ORAL

## 2019-12-14 ENCOUNTER — Encounter: Payer: Self-pay | Admitting: Physician Assistant

## 2019-12-14 NOTE — Progress Notes (Signed)
[facility] Department STI clinic/screening visit  Subjective:  Marissa Miller is a 29 y.o. female being seen today for an STI screening visit. The patient reports they do have symptoms.  Patient reports that they do not desire a pregnancy in the next year.   They reported they are not interested in discussing contraception today.  Patient's last menstrual period was 11/20/2019.   Patient has the following medical conditions:   Patient Active Problem List   Diagnosis Date Noted  . History of laparoscopic adjustable gastric banding 02/24/2011  . OSA (obstructive sleep apnea) 11/03/2010  . Morbid obesity (HCC) 10/15/2010  . HTN (hypertension) 10/15/2010  . Pre-diabetes 10/15/2010    Chief Complaint  Patient presents with  . SEXUALLY TRANSMITTED DISEASE    screening    HPI  Patient reports that she has had yellow/green discharge for 4 days, itching and dysuria.  States that she was treated for Chlamydia about 1 month ago but is not sure that her partner was treated.  States that she last had HIV testing 1 month ago and last pap was 02/2019.   See flowsheet for further details and programmatic requirements.    The following portions of the patient's history were reviewed and updated as appropriate: allergies, current medications, past medical history, past social history, past surgical history and problem list.  Objective:  There were no vitals filed for this visit.  Physical Exam Constitutional:      General: She is not in acute distress.    Appearance: Normal appearance.  HENT:     Head: Normocephalic and atraumatic.     Comments: No nits,lice, or hair loss. No cervical, supraclavicular or axillary adenopathy.    Mouth/Throat:     Mouth: Mucous membranes are moist.     Pharynx: Oropharynx is clear. No oropharyngeal exudate or posterior oropharyngeal erythema.  Eyes:     Conjunctiva/sclera: Conjunctivae normal.  Pulmonary:     Effort: Pulmonary effort is  normal.  Abdominal:     Palpations: Abdomen is soft. There is no mass.     Tenderness: There is no abdominal tenderness. There is no guarding or rebound.  Genitourinary:    General: Normal vulva.     Rectum: Normal.     Comments: External genitalia/pubic area without nits, lice, edema, erythema, lesions and inguinal adenopathy. Vagina with normal mucosa and a large amount of thin, yellow/green discharge, pH=4.5. Cervix without visible lesions. Uterus firm, mobile, nt, no masses, no CMT, no adnexal tenderness or fullness. Musculoskeletal:     Cervical back: Neck supple. No tenderness.  Skin:    General: Skin is warm and dry.     Findings: No bruising, erythema, lesion or rash.  Neurological:     Mental Status: She is alert and oriented to person, place, and time.  Psychiatric:        Mood and Affect: Mood normal.        Thought Content: Thought content normal.        Judgment: Judgment normal.      Assessment and Plan:  Marissa Miller is a 29 y.o. female presenting to the [facility] Department for STI screening  1. Screening for STD (sexually transmitted disease) Patient into clinic with symptoms. Patient declines blood work today.  Rec condoms with all sex. Await test results.  Counseled that RN will call if needs to RTC for treatment once results are back. - WET PREP FOR TRICH, YEAST, CLUE - Chlamydia/Gonorrhea [facility]  2. Prophylactic measure Will re-treat for Chlamydia due to possible re-exposure to untreated partner with Azithromycin 1 g po DOT. No sex for 7 days and until after partner completes treatment. RTC for re-treatment if vomits < 2 hr after taking medicine. - azithromycin (ZITHROMAX) tablet 1,000 mg  3. Candida vaginitis Treat for yeast with Clotrimazole 1% vaginal cream 1 app qhs for 7 days. - clotrimazole (CLOTRIMAZOLE-7) 1 % vaginal cream; Place 1 Applicatorful vaginally at bedtime for 7 days.  Dispense: 45 g; Refill: 0     No  follow-ups on file.  No future appointments.  Matt Holmes, PA

## 2022-03-16 ENCOUNTER — Emergency Department (HOSPITAL_BASED_OUTPATIENT_CLINIC_OR_DEPARTMENT_OTHER): Payer: Medicaid Other

## 2022-03-16 ENCOUNTER — Other Ambulatory Visit: Payer: Self-pay

## 2022-03-16 ENCOUNTER — Encounter (HOSPITAL_BASED_OUTPATIENT_CLINIC_OR_DEPARTMENT_OTHER): Payer: Self-pay

## 2022-03-16 ENCOUNTER — Emergency Department (HOSPITAL_BASED_OUTPATIENT_CLINIC_OR_DEPARTMENT_OTHER)
Admission: EM | Admit: 2022-03-16 | Discharge: 2022-03-16 | Disposition: A | Discharge status: Home / Self Care | Payer: Medicaid Other | Attending: Emergency Medicine | Admitting: Emergency Medicine

## 2022-03-16 DIAGNOSIS — I10 Essential (primary) hypertension: Secondary | ICD-10-CM | POA: Diagnosis not present

## 2022-03-16 DIAGNOSIS — R109 Unspecified abdominal pain: Secondary | ICD-10-CM | POA: Diagnosis present

## 2022-03-16 DIAGNOSIS — R1013 Epigastric pain: Secondary | ICD-10-CM | POA: Diagnosis not present

## 2022-03-16 DIAGNOSIS — Z9884 Bariatric surgery status: Secondary | ICD-10-CM

## 2022-03-16 LAB — URINALYSIS, ROUTINE W REFLEX MICROSCOPIC
Glucose, UA: NEGATIVE mg/dL
Hgb urine dipstick: NEGATIVE
Ketones, ur: 15 mg/dL — AB
Nitrite: NEGATIVE
Protein, ur: 30 mg/dL — AB
Specific Gravity, Urine: 1.046 — ABNORMAL HIGH (ref 1.005–1.030)
Trans Epithel, UA: <1
pH: 6 (ref 5.0–8.0)

## 2022-03-16 LAB — COMPREHENSIVE METABOLIC PANEL
ALT: 7 U/L (ref 0–44)
AST: 15 U/L (ref 15–41)
Albumin: 3.6 g/dL (ref 3.5–5.0)
Alkaline Phosphatase: 53 U/L (ref 38–126)
Anion gap: 8 (ref 5–15)
BUN: 17 mg/dL (ref 6–20)
CO2: 26 mmol/L (ref 22–32)
Calcium: 9 mg/dL (ref 8.9–10.3)
Chloride: 101 mmol/L (ref 98–111)
Creatinine, Ser: 0.87 mg/dL (ref 0.44–1.00)
GFR, Estimated: >60 mL/min (ref >60)
Glucose, Bld: 106 mg/dL — ABNORMAL HIGH (ref 70–99)
Potassium: 3.5 mmol/L (ref 3.5–5.1)
Sodium: 135 mmol/L (ref 135–145)
Total Bilirubin: 0.9 mg/dL (ref 0.3–1.2)
Total Protein: 7.3 g/dL (ref 6.5–8.1)

## 2022-03-16 LAB — CBC
HCT: 31.2 % — ABNORMAL LOW (ref 36.0–46.0)
Hemoglobin: 9.9 g/dL — ABNORMAL LOW (ref 12.0–15.0)
MCH: 22.1 pg — ABNORMAL LOW (ref 26.0–34.0)
MCHC: 31.7 g/dL (ref 30.0–36.0)
MCV: 69.8 fL — ABNORMAL LOW (ref 80.0–100.0)
Platelets: 330 10*3/uL (ref 150–400)
RBC: 4.47 MIL/uL (ref 3.87–5.11)
RDW: 21.9 % — ABNORMAL HIGH (ref 11.5–15.5)
WBC: 8.8 10*3/uL (ref 4.0–10.5)
nRBC: 0 % (ref 0.0–0.2)

## 2022-03-16 LAB — LIPASE, BLOOD: Lipase: 26 U/L (ref 11–51)

## 2022-03-16 LAB — PREGNANCY, URINE: Preg Test, Ur: NEGATIVE

## 2022-03-16 MED ORDER — ONDANSETRON HCL 4 MG/2ML IJ SOLN
4.0000 mg | Freq: Once | INTRAMUSCULAR | Status: AC
  Administered 2022-03-16: 4 mg via INTRAVENOUS
  Filled 2022-03-16: qty 2

## 2022-03-16 MED ORDER — HYDROCODONE-ACETAMINOPHEN 5-325 MG PO TABS: 1.0000 | ORAL_TABLET | Freq: Four times a day (QID) | ORAL | 0 refills | Status: AC | PRN

## 2022-03-16 MED ORDER — SODIUM CHLORIDE 0.9 % IV BOLUS
1000.0000 mL | Freq: Once | INTRAVENOUS | Status: AC
  Administered 2022-03-16: 1000 mL via INTRAVENOUS

## 2022-03-16 MED ORDER — MORPHINE SULFATE (PF) 4 MG/ML IV SOLN
4.0000 mg | Freq: Once | INTRAVENOUS | Status: AC
  Administered 2022-03-16: 4 mg via INTRAVENOUS
  Filled 2022-03-16: qty 1

## 2022-03-16 MED ORDER — IOHEXOL 300 MG/ML  SOLN
100.0000 mL | Freq: Once | INTRAMUSCULAR | Status: AC | PRN
  Administered 2022-03-16: 80 mL via INTRAVENOUS

## 2022-03-16 NOTE — Discharge Instructions (Signed)
Call the [facility] office this morning after 8:30 AM to arrange a follow-up appointment for today.  Dr. Jamas Lav and Dr. Kae Heller are both aware of your situation and want you seen today.

## 2022-03-16 NOTE — ED Notes (Signed)
Patient taken to CT.

## 2022-03-16 NOTE — ED Triage Notes (Signed)
Patient presents from home, states she has had lower quadrants abd pain for 4 days, states the pain has now moved to [place]. States she has been taking ibuprofen and advil to help with pain but pain is not alleviated. Denies NVD, only pain. Patient aaxo4, ambulatory.

## 2022-03-16 NOTE — ED Provider Notes (Signed)
[facility]GENC[facility] DEPT Provider Note   CSN: [id number] Arrival date & time: 03/16/22  0014     History  Chief Complaint  Patient presents with   Abdominal Pain    Elijah L Kreuzer is a 32 y.o. female.  Patient is a 32 year old female with past medical history of prior lap band surgery, hypertension, prediabetes.  Patient presenting today with complaints of abdominal pain.  This has been ongoing for the past 3 days.  She describes generalized abdominal pain, but is most pronounced in the right upper abdomen.  She denies any nausea, vomiting, or diarrhea.  She denies any fevers or chills.  She denies urinary complaints.  Her last menstrual period was 1 month ago and normal.  She has been taking ibuprofen with some relief.  The history is provided by the patient.       Home Medications Prior to Admission medications   Medication Sig Start Date End Date Taking? Authorizing Provider  calcium citrate-vitamin D 200-200 MG-UNIT TABS Take 4 tablets by mouth daily.   Patient not taking: Reported on 12/14/2019    [provider]  IBUPROFEN PO Take by mouth as needed.      [provider]  metFORMIN (GLUMETZA) 500 MG (MOD) 24 hr tablet Take 500 mg by mouth 2 (two) times daily.  Patient not taking: Reported on 12/14/2019    [provider]  Multiple Vitamins-Minerals (MULTIVITAMIN WITH MINERALS) tablet Take 1 tablet by mouth daily.      [provider]  norgestimate-ethinyl estradiol (ORTHO-CYCLEN,SPRINTEC,PREVIFEM) 0.25-35 MG-MCG tablet Take 1 tablet by mouth daily.   Patient not taking: Reported on 12/14/2019    [provider]  terconazole (TERAZOL 3) 80 MG vaginal suppository Place 1 suppository (80 mg total) vaginally at bedtime. Patient not taking: Reported on 12/14/2019 02/27/16   Billy Fischer, MD      Allergies    Elemental sulfur    Review of Systems   Review of Systems  All other systems reviewed and are  negative.   Physical Exam Updated Vital Signs BP (!) 132/95 (BP Location: Right Arm)   Pulse 100   Temp 98.6 F (37 C) (Oral)   Resp 18   Ht 5\' 3"  (1.6 m)   Wt 66.7 kg   LMP 02/14/2022 (Exact Date)   SpO2 100%   BMI 26.04 kg/m  Physical Exam Vitals and nursing note reviewed.  Constitutional:      General: She is not in acute distress.    Appearance: She is well-developed. She is not diaphoretic.  HENT:     Head: Normocephalic and atraumatic.  Cardiovascular:     Rate and Rhythm: Normal rate and regular rhythm.     Heart sounds: No murmur heard.    No friction rub. No gallop.  Pulmonary:     Effort: Pulmonary effort is normal. No respiratory distress.     Breath sounds: Normal breath sounds. No wheezing.  Abdominal:     General: Bowel sounds are normal. There is no distension.     Palpations: Abdomen is soft.     Tenderness: There is generalized abdominal tenderness. There is no right CVA tenderness, left CVA tenderness, guarding or rebound.  Musculoskeletal:        General: Normal range of motion.     Cervical back: Normal range of motion and neck supple.  Skin:    General: Skin is warm and dry.  Neurological:     General: No focal deficit present.  Mental Status: She is alert and oriented to person, place, and time.     ED Results / Procedures / Treatments   Labs (all labs ordered are listed, but only abnormal results are displayed) Labs Reviewed  COMPREHENSIVE METABOLIC PANEL - Abnormal; Notable for the following components:      Result Value   Glucose, Bld 106 (*)    All other components within normal limits  CBC - Abnormal; Notable for the following components:   Hemoglobin 9.9 (*)    HCT 31.2 (*)    MCV 69.8 (*)    MCH 22.1 (*)    RDW 21.9 (*)    All other components within normal limits  URINALYSIS, ROUTINE W REFLEX MICROSCOPIC - Abnormal; Notable for the following components:   APPearance HAZY (*)    Specific Gravity, Urine 1.046 (*)     Bilirubin Urine SMALL (*)    Ketones, ur 15 (*)    Protein, ur 30 (*)    Leukocytes,Ua TRACE (*)    Bacteria, UA MANY (*)    All other components within normal limits  LIPASE, BLOOD  PREGNANCY, URINE    EKG EKG Interpretation  Date/Time:  Tuesday March 16 2022 00:35:11 EST Ventricular Rate:  109 PR Interval:  134 QRS Duration: 70 QT Interval:  314 QTC Calculation: 422 R Axis:   86 Text Interpretation: Sinus tachycardia Nonspecific T wave abnormality Abnormal ECG No significant change since 07/30/2010 Confirmed by Geoffery Lyons ([id number]) on 03/16/2022 2:03:19 AM  Radiology No results found.  Procedures Procedures    Medications Ordered in ED Medications  morphine (PF) 4 MG/ML injection 4 mg (has no administration in time range)  ondansetron (ZOFRAN) injection 4 mg (has no administration in time range)  sodium chloride 0.9 % bolus 1,000 mL (has no administration in time range)    ED Course/ Medical Decision Making/ A&P  Patient is a 32 year old female with past medical history of morbid obesity with lap band surgery performed 12 years ago.  She presents today with upper abdominal discomfort as described in the HPI.  She arrives here with stable vital signs.  There is some tenderness in the epigastric region and right abdomen, but no peritoneal signs.  Laboratory studies obtained showing no leukocytosis or other obvious abnormality.  CT scan obtained showing the gastric band about the GE junction.  The distal esophagus is dilated and fluid-filled with wall thickening and mucosal hyperenhancement compatible with esophagitis with clinical correlation for gastric band dysfunction recommended.  I discussed the situation with Dr. Bedelia Person from general surgery.  She has spoken with Dr. Fredricka Bonine who is recommending the patient come to the office for adjustment of the device.  Patient to be discharged with follow-up with general surgery later today.  Final Clinical Impression(s) / ED  Diagnoses Final diagnoses:  None    Rx / DC Orders ED Discharge Orders     None         Geoffery Lyons, MD 03/16/22 0430

## 2024-01-26 ENCOUNTER — Telehealth: Payer: Self-pay

## 2024-01-26 NOTE — Telephone Encounter (Signed)
 Patient called our office this morning and spoke with Crystal who reached out to [facility] about patient needing continuation of care from her surgery done in [place] . Patient advised that she had been told by some one in our office that when she called her Monday or Tuesday of this week that she was assured we could see her if she would just obtain her records.   I called and talked with patient and advised her that after reviewing information, unfortunately we did not have anyone here at our practice that could manage her in post op care from surgery that she had done in [place] . I spoke with one of our surgeons and was advised this sounds more like something a trauma surgeon would handle. I did let her know that she may could reach out to Dr Layman office to see if they could help point her in the right direction of who may be willing to take over her care. I did let her know that this was not a guarantee that his office would treat her.  I let her know this was our error and we would certainly look into this and did apologize for the miscommunication.

## 2024-02-01 ENCOUNTER — Ambulatory Visit: Admitting: Orthopedic Surgery
